# Patient Record
Sex: Female | Born: 1955 | Race: Black or African American | Hispanic: No | Marital: Married | Smoking: Never smoker
Health system: Southern US, Community
[De-identification: ages and names within clinical notes are randomized; demographics above are authoritative.]

## PROBLEM LIST (undated history)

## (undated) DIAGNOSIS — E119 Type 2 diabetes mellitus without complications: Secondary | ICD-10-CM

## (undated) DIAGNOSIS — M1990 Unspecified osteoarthritis, unspecified site: Secondary | ICD-10-CM

## (undated) DIAGNOSIS — M5136 Other intervertebral disc degeneration, lumbar region: Secondary | ICD-10-CM

## (undated) DIAGNOSIS — E1140 Type 2 diabetes mellitus with diabetic neuropathy, unspecified: Secondary | ICD-10-CM

## (undated) DIAGNOSIS — I10 Essential (primary) hypertension: Secondary | ICD-10-CM

## (undated) DIAGNOSIS — M797 Fibromyalgia: Secondary | ICD-10-CM

## (undated) DIAGNOSIS — K219 Gastro-esophageal reflux disease without esophagitis: Secondary | ICD-10-CM

## (undated) DIAGNOSIS — M722 Plantar fascial fibromatosis: Secondary | ICD-10-CM

## (undated) DIAGNOSIS — E079 Disorder of thyroid, unspecified: Secondary | ICD-10-CM

## (undated) DIAGNOSIS — M5416 Radiculopathy, lumbar region: Secondary | ICD-10-CM

## (undated) DIAGNOSIS — M5137 Other intervertebral disc degeneration, lumbosacral region: Secondary | ICD-10-CM

## (undated) DIAGNOSIS — G8929 Other chronic pain: Secondary | ICD-10-CM

## (undated) DIAGNOSIS — F419 Anxiety disorder, unspecified: Secondary | ICD-10-CM

## (undated) DIAGNOSIS — M51379 Other intervertebral disc degeneration, lumbosacral region without mention of lumbar back pain or lower extremity pain: Secondary | ICD-10-CM

## (undated) DIAGNOSIS — G4730 Sleep apnea, unspecified: Secondary | ICD-10-CM

## (undated) DIAGNOSIS — M549 Dorsalgia, unspecified: Secondary | ICD-10-CM

## (undated) DIAGNOSIS — G709 Myoneural disorder, unspecified: Secondary | ICD-10-CM

## (undated) HISTORY — DX: Plantar fascial fibromatosis: M72.2

## (undated) HISTORY — PX: ABDOMINAL HYSTERECTOMY: SHX81

## (undated) HISTORY — PX: OTHER SURGICAL HISTORY: SHX169

## (undated) HISTORY — DX: Myoneural disorder, unspecified: G70.9

## (undated) HISTORY — DX: Disorder of thyroid, unspecified: E07.9

## (undated) HISTORY — DX: Essential (primary) hypertension: I10

## (undated) HISTORY — PX: CHOLECYSTECTOMY: SHX55

## (undated) HISTORY — DX: Anxiety disorder, unspecified: F41.9

## (undated) HISTORY — DX: Type 2 diabetes mellitus without complications: E11.9

## (undated) HISTORY — PX: KNEE SURGERY: SHX244

## (undated) HISTORY — DX: Fibromyalgia: M79.7

## (undated) HISTORY — DX: Type 2 diabetes mellitus with diabetic neuropathy, unspecified: E11.40

---

## 2003-06-02 ENCOUNTER — Other Ambulatory Visit: Payer: Self-pay

## 2004-01-06 ENCOUNTER — Emergency Department: Payer: Self-pay | Admitting: Unknown Physician Specialty

## 2004-01-17 ENCOUNTER — Ambulatory Visit: Payer: Self-pay | Admitting: Otolaryngology

## 2004-08-10 ENCOUNTER — Ambulatory Visit: Payer: Self-pay

## 2005-04-19 ENCOUNTER — Ambulatory Visit: Payer: Self-pay | Admitting: Family Medicine

## 2006-10-27 ENCOUNTER — Emergency Department: Payer: Self-pay | Admitting: Emergency Medicine

## 2006-11-06 ENCOUNTER — Encounter: Payer: Self-pay | Admitting: Specialist

## 2006-11-17 ENCOUNTER — Encounter: Payer: Self-pay | Admitting: Specialist

## 2006-12-17 ENCOUNTER — Encounter: Payer: Self-pay | Admitting: Specialist

## 2007-01-17 ENCOUNTER — Encounter: Payer: Self-pay | Admitting: Specialist

## 2007-05-14 ENCOUNTER — Ambulatory Visit: Payer: Self-pay | Admitting: Family Medicine

## 2007-09-10 ENCOUNTER — Ambulatory Visit: Payer: Self-pay

## 2007-10-21 ENCOUNTER — Ambulatory Visit: Payer: Self-pay

## 2009-11-08 ENCOUNTER — Ambulatory Visit: Payer: Self-pay | Admitting: Gastroenterology

## 2010-04-28 ENCOUNTER — Emergency Department: Payer: Self-pay | Admitting: Emergency Medicine

## 2012-05-07 ENCOUNTER — Ambulatory Visit: Payer: Self-pay | Admitting: Pain Medicine

## 2012-05-21 ENCOUNTER — Ambulatory Visit: Payer: Self-pay | Admitting: Pain Medicine

## 2012-06-02 ENCOUNTER — Ambulatory Visit: Payer: Self-pay | Admitting: Pain Medicine

## 2012-06-17 ENCOUNTER — Ambulatory Visit: Payer: Self-pay | Admitting: Pain Medicine

## 2012-07-01 ENCOUNTER — Ambulatory Visit: Payer: Self-pay | Admitting: Pain Medicine

## 2012-07-13 ENCOUNTER — Ambulatory Visit: Payer: Self-pay | Admitting: Pain Medicine

## 2012-07-30 ENCOUNTER — Ambulatory Visit: Payer: Self-pay | Admitting: Pain Medicine

## 2013-03-31 DIAGNOSIS — E669 Obesity, unspecified: Secondary | ICD-10-CM | POA: Insufficient documentation

## 2013-03-31 DIAGNOSIS — M159 Polyosteoarthritis, unspecified: Secondary | ICD-10-CM | POA: Insufficient documentation

## 2013-03-31 DIAGNOSIS — G8929 Other chronic pain: Secondary | ICD-10-CM | POA: Insufficient documentation

## 2013-03-31 DIAGNOSIS — I10 Essential (primary) hypertension: Secondary | ICD-10-CM | POA: Insufficient documentation

## 2013-04-14 DIAGNOSIS — K219 Gastro-esophageal reflux disease without esophagitis: Secondary | ICD-10-CM | POA: Insufficient documentation

## 2013-12-21 ENCOUNTER — Ambulatory Visit: Payer: Self-pay | Admitting: Pain Medicine

## 2014-01-05 ENCOUNTER — Ambulatory Visit: Payer: Self-pay | Admitting: Pain Medicine

## 2014-01-17 ENCOUNTER — Ambulatory Visit: Payer: Self-pay | Admitting: Pain Medicine

## 2014-01-26 ENCOUNTER — Ambulatory Visit: Payer: Self-pay | Admitting: Pain Medicine

## 2014-02-15 ENCOUNTER — Ambulatory Visit: Payer: Self-pay | Admitting: Pain Medicine

## 2014-03-07 ENCOUNTER — Ambulatory Visit: Payer: Self-pay | Admitting: Pain Medicine

## 2014-03-15 ENCOUNTER — Ambulatory Visit: Payer: Self-pay | Admitting: Pain Medicine

## 2014-04-14 ENCOUNTER — Ambulatory Visit: Payer: Self-pay | Admitting: Pain Medicine

## 2014-05-17 ENCOUNTER — Ambulatory Visit: Payer: Self-pay | Admitting: Pain Medicine

## 2014-06-16 ENCOUNTER — Ambulatory Visit
Admit: 2014-06-16 | Disposition: A | Discharge status: Home / Self Care | Payer: Self-pay | Attending: Pain Medicine | Admitting: Pain Medicine

## 2014-07-14 ENCOUNTER — Ambulatory Visit
Admit: 2014-07-14 | Disposition: A | Discharge status: Home / Self Care | Payer: Self-pay | Attending: Pain Medicine | Admitting: Pain Medicine

## 2014-08-11 ENCOUNTER — Ambulatory Visit: Payer: Medicare Other | Attending: Pain Medicine | Admitting: Pain Medicine

## 2014-08-11 ENCOUNTER — Encounter (INDEPENDENT_AMBULATORY_CARE_PROVIDER_SITE_OTHER): Payer: Self-pay

## 2014-08-11 ENCOUNTER — Encounter: Payer: Self-pay | Admitting: Pain Medicine

## 2014-08-11 VITALS — BP 144/87 | HR 79 | Temp 98.2°F | Resp 16 | Ht 64.0 in | Wt 240.0 lb

## 2014-08-11 DIAGNOSIS — M5116 Intervertebral disc disorders with radiculopathy, lumbar region: Secondary | ICD-10-CM | POA: Diagnosis not present

## 2014-08-11 DIAGNOSIS — M5136 Other intervertebral disc degeneration, lumbar region: Secondary | ICD-10-CM | POA: Diagnosis not present

## 2014-08-11 DIAGNOSIS — M545 Low back pain: Secondary | ICD-10-CM | POA: Diagnosis present

## 2014-08-11 DIAGNOSIS — E1140 Type 2 diabetes mellitus with diabetic neuropathy, unspecified: Secondary | ICD-10-CM | POA: Diagnosis not present

## 2014-08-11 DIAGNOSIS — M79604 Pain in right leg: Secondary | ICD-10-CM | POA: Diagnosis present

## 2014-08-11 DIAGNOSIS — M79605 Pain in left leg: Secondary | ICD-10-CM | POA: Diagnosis present

## 2014-08-11 DIAGNOSIS — E1340 Other specified diabetes mellitus with diabetic neuropathy, unspecified: Secondary | ICD-10-CM

## 2014-08-11 DIAGNOSIS — M5030 Other cervical disc degeneration, unspecified cervical region: Secondary | ICD-10-CM | POA: Diagnosis not present

## 2014-08-11 DIAGNOSIS — M533 Sacrococcygeal disorders, not elsewhere classified: Secondary | ICD-10-CM | POA: Insufficient documentation

## 2014-08-11 DIAGNOSIS — M47816 Spondylosis without myelopathy or radiculopathy, lumbar region: Secondary | ICD-10-CM

## 2014-08-11 MED ORDER — OXYCODONE HCL 10 MG PO TABS: ORAL_TABLET | ORAL | Status: DC

## 2014-08-11 NOTE — Progress Notes (Signed)
Discharge to home ambulatory. Script given as ordered. teachback 3 complete.

## 2014-08-11 NOTE — Progress Notes (Signed)
   Subjective:    Patient ID: Diana Bishop, female    DOB: [birth year], 59 y.o.   MRN: [id number]  HPI  Patient is 59 year old female returns to pain management Center for further evaluation and treatment of pain involving the lower back and lower extremity region with pain involving the region of the neck of lesser degree. Patient denies trauma change in events of daily living the call significant change in her symptoms. Patient admits to some mild discomfort occurring when patient attempts to turn over in bed and states that the pain does become somewhat discomfortable near the end of the day if patient has been standing for significant period of time. We discussed radiofrequency procedures which we will avoid at this time and will continue medications as prescribed. Patient is to call pain management should there be change in condition prior to scheduled return appointment. Patient is understanding and agrees with plan.     Review of Systems     Objective:   Physical Exam   physical examination revealed tenderness over the region of the splenius capitis and occipitalis muscles. Palpation of the acromioclavicular and glenohumeral joint region reproduced minimal discomfort and there was  unremarkable Spurling's maneuver with minimal tenderness to palpation of the acromial clavicular and glenohumeral joint regions. There appeared to be bilaterally equal grip strength with without increased pain with Tinel and Phalen's maneuver. Palpation of the thoracic facet reproduced minimal discomfort and no crepitus of the thoracic region was noted. Palpation of the lumbar paraspinal musculature region lumbar facet region was associated with moderate discomfort with lateral bending and rotation and extension and palpation of the lumbar facets reproducing moderate discomfort. Straight leg raising was tolerated to approximately 20 without a definite increased pain with dorsiflexion noted and there was  negative clonus and negative Homans. There was mild tenderness over the PSIS and PII S regions and palpation of the region of the gluteal and piriformis muscles reproduced mild to moderate discomfort.. Abdomen nontender and no costovertebral angle tenderness noted      Assessment & Plan:    Degenerative disc disease lumbar spine L4-L5 level focal disc protrusion, mass effect upon the posterior lateral aspect of the CSF space as well as mass effect upon the central aspect of the L4 nerve root on the right consistent with nerve root compression and compromise, findings concerning for free fragment best appreciated on images 28 through 31 on axial T2 weighted images  Lumbar facet syndrome  Lumbar radiculopathy  Sacroiliac joint dysfunction  Diabetic neuropathy  Degenerative disc disease cervical spine  Cervical facet syndrome  Plan   Continue present medications.  F/U PCP for evaliation of  BP and general medical  condition.  F/U surgical evaluation.  F/U neurological evaluation.  May consider radiofrequency rhizolysis or intraspinal procedures pending response to present treatment and F/U evaluation.  Patient to call Pain Management Center should patient have concerns prior to scheduled return appointment.

## 2014-08-11 NOTE — Patient Instructions (Addendum)
Continue present medications.  F/U PCP for evaliation of  BP and general medical  condition.  F/U surgical evaluation.  F/U neurological evaluation.  May consider radiofrequency rhizolysis or intraspinal procedures pending response to present treatment and F/U evaluation.  Patient to call Pain Management Center should patient have concerns prior to scheduled return appointment. Pain Management Discharge Instructions  General Discharge Instructions :  If you need to reach your doctor call: Monday-Friday 8:00 am - 4:00 pm at [phone] or toll free [phone].  After clinic hours [phone] to have operator reach doctor.  Bring all of your medication bottles to all your appointments in the pain clinic.  To cancel or reschedule your appointment with Pain Management please remember to call 24 hours in advance to avoid a fee.  Refer to the educational materials which you have been given on: General Risks, I had my Procedure. Discharge Instructions, Post Sedation.  Post Procedure Instructions:  The drugs you were given will stay in your system until tomorrow, so for the next 24 hours you should not drive, make any legal decisions or drink any alcoholic beverages.  You may eat anything you prefer, but it is better to start with liquids then soups and crackers, and gradually work up to solid foods.  Please notify your doctor immediately if you have any unusual bleeding, trouble breathing or pain that is not related to your normal pain.  Depending on the type of procedure that was done, some parts of your body may feel week and/or numb.  This usually clears up by tonight or the next day.  Walk with the use of an assistive device or accompanied by an adult for the 24 hours.  You may use ice on the affected area for the first 24 hours.  Put ice in a Ziploc bag and cover with a towel and place against area 15 minutes on 15 minutes off.  You may switch to heat after 24 hours. 

## 2014-08-11 NOTE — Progress Notes (Signed)
   Subjective:    Patient ID: Diana Bishop, female    DOB: 04-Apr-1955, 59 y.o.   MRN: [id number]  HPI    Review of Systems     Objective:   Physical Exam        Assessment & Plan:

## 2014-08-18 ENCOUNTER — Other Ambulatory Visit: Payer: Self-pay | Admitting: Pain Medicine

## 2014-09-13 ENCOUNTER — Ambulatory Visit: Payer: Medicare Other | Attending: Pain Medicine | Admitting: Pain Medicine

## 2014-09-13 ENCOUNTER — Encounter: Payer: Self-pay | Admitting: Pain Medicine

## 2014-09-13 VITALS — BP 151/82 | HR 79 | Temp 97.8°F | Resp 16 | Ht 65.0 in | Wt 243.0 lb

## 2014-09-13 DIAGNOSIS — M7060 Trochanteric bursitis, unspecified hip: Secondary | ICD-10-CM | POA: Diagnosis not present

## 2014-09-13 DIAGNOSIS — M5416 Radiculopathy, lumbar region: Secondary | ICD-10-CM | POA: Diagnosis not present

## 2014-09-13 DIAGNOSIS — M79605 Pain in left leg: Secondary | ICD-10-CM | POA: Diagnosis present

## 2014-09-13 DIAGNOSIS — M4806 Spinal stenosis, lumbar region: Secondary | ICD-10-CM | POA: Insufficient documentation

## 2014-09-13 DIAGNOSIS — E1140 Type 2 diabetes mellitus with diabetic neuropathy, unspecified: Secondary | ICD-10-CM | POA: Diagnosis not present

## 2014-09-13 DIAGNOSIS — M533 Sacrococcygeal disorders, not elsewhere classified: Secondary | ICD-10-CM | POA: Diagnosis not present

## 2014-09-13 DIAGNOSIS — M5136 Other intervertebral disc degeneration, lumbar region: Secondary | ICD-10-CM | POA: Insufficient documentation

## 2014-09-13 DIAGNOSIS — M545 Low back pain: Secondary | ICD-10-CM | POA: Diagnosis present

## 2014-09-13 DIAGNOSIS — M79604 Pain in right leg: Secondary | ICD-10-CM | POA: Diagnosis present

## 2014-09-13 MED ORDER — OXYCODONE HCL 10 MG PO TABS: ORAL_TABLET | ORAL | Status: DC

## 2014-09-13 MED ORDER — DICLOFENAC SODIUM 1 % TD GEL: TRANSDERMAL | Status: DC

## 2014-09-13 NOTE — Progress Notes (Signed)
   Subjective:    Patient ID: Diana Bishop, female    DOB: September 16, 1955, 59 y.o.   MRN: [id number]  HPI  Patient is 59 year old female returns to [facility] for further evaluation and treatment of pain involving the lower back lower extremity region predominantly with pain of the neck and upper extremity region of lesser degree. Patient states that she has significant pain occurring in the region hips. The pain is aggravated by attempt to lie on the left and right side. Patient states that the pain awakens her from sleep. We discussed patient's condition patient appeared to be with component of greater trochanteric bursitis. We will prescribed Flector patch palpation and will continue oxycodone for treatment of patient's lower back lower extremity pain. After attempting to describe Flector patch insurance would not approve the Flector patch. We therefore prescribed Voltaren gel for the patient's pain consisting of what is felt to be greater trochanteric bursitis. The patient was understanding and in agreement status treatment plan. We will remain available to consider patient for greater trochanteric bursa injection should patient be with continued pain despite the present treatment regimen. The patient was understanding and agreement with suggested treatment plan.      Review of Systems     Objective:   Physical Exam   Palpation over the cervical cervical facet cervical paraspinal musculature region was with minimal tinged palpation. Palpation of the splenius capitis occipitalis regions reproduced mild discomfort. There was unremarkable Spurling's maneuver. Minimal tenderness of the acromioclavicular glenohumeral joint region. Tinel and Phalen's maneuver without increased pain of significant degree. Palpation of the thoracic facet and cervical facet and paraspinal musculature region was a tends to palpation of moderate degree. Palpation over the lumbar paraspinal musculature region  lumbar facet region was a tends to palpation of moderate degree with severe tenderness to palpation of the greater trochanteric region iliotibial band region on the left as well as on the right. Straight leg raising tolerates approximately 20 without increased pain with dorsiflexion noted. There was question decreased sensation of the lower extremities in a stocking type distribution. Abdomen was nontender and no costovertebral angle tenderness was noted.         Assessment & Plan:  Degenerative disc disease lumbar spine Findings have been consistent with a cranially migrating focal disc protrusion at L4-L5 causing mass effect upon the posterior lateral aspect of the thecal sac on the right causing mass effect upon the central aspect of the L4 nerve root on the right  Lumbar radiculopathy  Lumbar stenosis with neurogenic claudication  Lumbar facet syndrome  Diabetic neuropathy  Greater trochanteric bursitis  Sacroiliac joint dysfunction     Plan   Continue present medications oxycodone and begin full Voltaren gel. Please note that patient's insurance would not approve the Flector patch  F/U PCP for evaliation of  BP and general medical  condition.  F/U surgical evaluation  F/U neurological evaluation  May consider radiofrequency rhizolysis or intraspinal procedures pending response to present treatment and F/U evaluation.  Patient to call Pain Management Center should patient have concerns prior to scheduled return appointment.

## 2014-09-13 NOTE — Progress Notes (Signed)
Safety precautions to be maintained throughout the outpatient stay will include: orient to surroundings, keep bed in low position, maintain call bell within reach at all times, provide assistance with transfer out of bed and ambulation.  

## 2014-09-13 NOTE — Patient Instructions (Addendum)
Continue present medications oxycodone and generic Flector patch  F/U PCP for evaliation of  BP and general medical  condition.  F/U surgical evaluation.  F/U neurological evaluation.  May consider radiofrequency rhizolysis or intraspinal procedures pending response to present treatment and F/U evaluation.  Patient to call Pain Management Center should patient have concerns prior to scheduled return appointment.

## 2014-10-13 ENCOUNTER — Ambulatory Visit: Payer: Medicare Other | Attending: Pain Medicine | Admitting: Pain Medicine

## 2014-10-13 ENCOUNTER — Encounter: Payer: Self-pay | Admitting: Pain Medicine

## 2014-10-13 VITALS — BP 135/92 | HR 97 | Temp 98.1°F | Resp 18 | Ht 65.0 in | Wt 242.0 lb

## 2014-10-13 DIAGNOSIS — M5416 Radiculopathy, lumbar region: Secondary | ICD-10-CM | POA: Diagnosis not present

## 2014-10-13 DIAGNOSIS — M7060 Trochanteric bursitis, unspecified hip: Secondary | ICD-10-CM | POA: Diagnosis not present

## 2014-10-13 DIAGNOSIS — M4806 Spinal stenosis, lumbar region: Secondary | ICD-10-CM | POA: Diagnosis not present

## 2014-10-13 DIAGNOSIS — E1140 Type 2 diabetes mellitus with diabetic neuropathy, unspecified: Secondary | ICD-10-CM | POA: Diagnosis not present

## 2014-10-13 DIAGNOSIS — M79605 Pain in left leg: Secondary | ICD-10-CM | POA: Diagnosis present

## 2014-10-13 DIAGNOSIS — E1340 Other specified diabetes mellitus with diabetic neuropathy, unspecified: Secondary | ICD-10-CM

## 2014-10-13 DIAGNOSIS — M5136 Other intervertebral disc degeneration, lumbar region: Secondary | ICD-10-CM

## 2014-10-13 DIAGNOSIS — M533 Sacrococcygeal disorders, not elsewhere classified: Secondary | ICD-10-CM | POA: Diagnosis not present

## 2014-10-13 DIAGNOSIS — M545 Low back pain: Secondary | ICD-10-CM | POA: Diagnosis present

## 2014-10-13 DIAGNOSIS — M79604 Pain in right leg: Secondary | ICD-10-CM | POA: Diagnosis present

## 2014-10-13 DIAGNOSIS — M5126 Other intervertebral disc displacement, lumbar region: Secondary | ICD-10-CM | POA: Diagnosis not present

## 2014-10-13 DIAGNOSIS — M47816 Spondylosis without myelopathy or radiculopathy, lumbar region: Secondary | ICD-10-CM

## 2014-10-13 MED ORDER — OXYCODONE HCL 10 MG PO TABS: ORAL_TABLET | ORAL | Status: DC

## 2014-10-13 NOTE — Patient Instructions (Addendum)
Continue present medication oxycodone  F/U PCP Dr. Lennox Grumbles for evaliation of  BP diabetes mellitus and general medical  condition.  F/U surgical evaluation  F/U neurological evaluation  May consider radiofrequency rhizolysis or intraspinal procedures pending response to present treatment and F/U evaluation Please ask nurses for radiofrequency material for you to read..  Patient to call Pain Management Center should patient have concerns prior to scheduled return appointment. GENERAL RISKS AND COMPLICATIONS  What are the risk, side effects and possible complications? Generally speaking, most procedures are safe.  However, with any procedure there are risks, side effects, and the possibility of complications.  The risks and complications are dependent upon the sites that are lesioned, or the type of nerve block to be performed.  The closer the procedure is to the spine, the more serious the risks are.  Great care is taken when placing the radio frequency needles, block needles or lesioning probes, but sometimes complications can occur. 1. Infection: Any time there is an injection through the skin, there is a risk of infection.  This is why sterile conditions are used for these blocks.  There are four possible types of infection. 1. Localized skin infection. 2. Central Nervous System Infection-This can be in the form of Meningitis, which can be deadly. 3. Epidural Infections-This can be in the form of an epidural abscess, which can cause pressure inside of the spine, causing compression of the spinal cord with subsequent paralysis. This would require an emergency surgery to decompress, and there are no guarantees that the patient would recover from the paralysis. 4. Discitis-This is an infection of the intervertebral discs.  It occurs in about 1% of discography procedures.  It is difficult to treat and it may lead to surgery.        2. Pain: the needles have to go through skin and soft tissues, will  cause soreness.       3. Damage to internal structures:  The nerves to be lesioned may be near blood vessels or    other nerves which can be potentially damaged.       4. Bleeding: Bleeding is more common if the patient is taking blood thinners such as  aspirin, Coumadin, Ticiid, Plavix, etc., or if he/she have some genetic predisposition  such as hemophilia. Bleeding into the spinal canal can cause compression of the spinal  cord with subsequent paralysis.  This would require an emergency surgery to  decompress and there are no guarantees that the patient would recover from the  paralysis.       5. Pneumothorax:  Puncturing of a lung is a possibility, every time a needle is introduced in  the area of the chest or upper back.  Pneumothorax refers to free air around the  collapsed lung(s), inside of the thoracic cavity (chest cavity).  Another two possible  complications related to a similar event would include: Hemothorax and Chylothorax.   These are variations of the Pneumothorax, where instead of air around the collapsed  lung(s), you may have blood or chyle, respectively.       6. Spinal headaches: They may occur with any procedures in the area of the spine.       7. Persistent CSF (Cerebro-Spinal Fluid) leakage: This is a rare problem, but may occur  with prolonged intrathecal or epidural catheters either due to the formation of a fistulous  track or a dural tear.       8. Nerve damage: By working so close to  the spinal cord, there is always a possibility of  nerve damage, which could be as serious as a permanent spinal cord injury with  paralysis.       9. Death:  Although rare, severe deadly allergic reactions known as "Anaphylactic  reaction" can occur to any of the medications used.      10. Worsening of the symptoms:  We can always make thing worse.  What are the chances of something like this happening? Chances of any of this occuring are extremely low.  By statistics, you have more of a chance  of getting killed in a motor vehicle accident: while driving to the hospital than any of the above occurring .  Nevertheless, you should be aware that they are possibilities.  In general, it is similar to taking a shower.  Everybody knows that you can slip, hit your head and get killed.  Does that mean that you should not shower again?  Nevertheless always keep in mind that statistics do not mean anything if you happen to be on the wrong side of them.  Even if a procedure has a 1 (one) in a 1,000,000 (million) chance of going wrong, it you happen to be that one..Also, keep in mind that by statistics, you have more of a chance of having something go wrong when taking medications.  Who should not have this procedure? If you are on a blood thinning medication (e.g. Coumadin, Plavix, see list of "Blood Thinners"), or if you have an active infection going on, you should not have the procedure.  If you are taking any blood thinners, please inform your physician.  How should I prepare for this procedure?  Do not eat or drink anything at least six hours prior to the procedure.  Bring a driver with you .  It cannot be a taxi.  Come accompanied by an adult that can drive you back, and that is strong enough to help you if your legs get weak or numb from the local anesthetic.  Take all of your medicines the morning of the procedure with just enough water to swallow them.  If you have diabetes, make sure that you are scheduled to have your procedure done first thing in the morning, whenever possible.  If you have diabetes, take only half of your insulin dose and notify our nurse that you have done so as soon as you arrive at the clinic.  If you are diabetic, but only take blood sugar pills (oral hypoglycemic), then do not take them on the morning of your procedure.  You may take them after you have had the procedure.  Do not take aspirin or any aspirin-containing medications, at least eleven (11) days prior  to the procedure.  They may prolong bleeding.  Wear loose fitting clothing that may be easy to take off and that you would not mind if it got stained with Betadine or blood.  Do not wear any jewelry or perfume  Remove any nail coloring.  It will interfere with some of our monitoring equipment.  NOTE: Remember that this is not meant to be interpreted as a complete list of all possible complications.  Unforeseen problems may occur.  BLOOD THINNERS The following drugs contain aspirin or other products, which can cause increased bleeding during surgery and should not be taken for 2 weeks prior to and 1 week after surgery.  If you should need take something for relief of minor pain, you may take acetaminophen which is found  in Tylenol,m Datril, Anacin-3 and Panadol. It is not blood thinner. The products listed below are.  Do not take any of the products listed below in addition to any listed on your instruction sheet.  A.P.C or A.P.C with Codeine Codeine Phosphate Capsules #3 Ibuprofen Ridaura  ABC compound Congesprin Imuran rimadil  Advil Cope Indocin Robaxisal  Alka-Seltzer Effervescent Pain Reliever and Antacid Coricidin or Coricidin-D  Indomethacin Rufen  Alka-Seltzer plus Cold Medicine Cosprin Ketoprofen S-A-C Tablets  Anacin Analgesic Tablets or Capsules Coumadin Korlgesic Salflex  Anacin Extra Strength Analgesic tablets or capsules CP-2 Tablets Lanoril Salicylate  Anaprox Cuprimine Capsules Levenox Salocol  Anexsia-D Dalteparin Magan Salsalate  Anodynos Darvon compound Magnesium Salicylate Sine-off  Ansaid Dasin Capsules Magsal Sodium Salicylate  Anturane Depen Capsules Marnal Soma  APF Arthritis pain formula Dewitt's Pills Measurin Stanback  Argesic Dia-Gesic Meclofenamic Sulfinpyrazone  Arthritis Bayer Timed Release Aspirin Diclofenac Meclomen Sulindac  Arthritis pain formula Anacin Dicumarol Medipren Supac  Analgesic (Safety coated) Arthralgen Diffunasal Mefanamic Suprofen   Arthritis Strength Bufferin Dihydrocodeine Mepro Compound Suprol  Arthropan liquid Dopirydamole Methcarbomol with Aspirin Synalgos  ASA tablets/Enseals Disalcid Micrainin Tagament  Ascriptin Doan's Midol Talwin  Ascriptin A/D Dolene Mobidin Tanderil  Ascriptin Extra Strength Dolobid Moblgesic Ticlid  Ascriptin with Codeine Doloprin or Doloprin with Codeine Momentum Tolectin  Asperbuf Duoprin Mono-gesic Trendar  Aspergum Duradyne Motrin or Motrin IB Triminicin  Aspirin plain, buffered or enteric coated Durasal Myochrisine Trigesic  Aspirin Suppositories Easprin Nalfon Trillsate  Aspirin with Codeine Ecotrin Regular or Extra Strength Naprosyn Uracel  Atromid-S Efficin Naproxen Ursinus  Auranofin Capsules Elmiron Neocylate Vanquish  Axotal Emagrin Norgesic Verin  Azathioprine Empirin or Empirin with Codeine Normiflo Vitamin E  Azolid Emprazil Nuprin Voltaren  Bayer Aspirin plain, buffered or children's or timed BC Tablets or powders Encaprin Orgaran Warfarin Sodium  Buff-a-Comp Enoxaparin Orudis Zorpin  Buff-a-Comp with Codeine Equegesic Os-Cal-Gesic   Buffaprin Excedrin plain, buffered or Extra Strength Oxalid   Bufferin Arthritis Strength Feldene Oxphenbutazone   Bufferin plain or Extra Strength Feldene Capsules Oxycodone with Aspirin   Bufferin with Codeine Fenoprofen Fenoprofen Pabalate or Pabalate-SF   Buffets II Flogesic Panagesic   Buffinol plain or Extra Strength Florinal or Florinal with Codeine Panwarfarin   Buf-Tabs Flurbiprofen Penicillamine   Butalbital Compound Four-way cold tablets Penicillin   Butazolidin Fragmin Pepto-Bismol   Carbenicillin Geminisyn Percodan   Carna Arthritis Reliever Geopen Persantine   Carprofen Gold's salt Persistin   Chloramphenicol Goody's Phenylbutazone   Chloromycetin Haltrain Piroxlcam   Clmetidine heparin Plaquenil   Cllnoril Hyco-pap Ponstel   Clofibrate Hydroxy chloroquine Propoxyphen         Before stopping any of these medications,  be sure to consult the physician who ordered them.  Some, such as Coumadin (Warfarin) are ordered to prevent or treat serious conditions such as "deep thrombosis", "pumonary embolisms", and other heart problems.  The amount of time that you may need off of the medication may also vary with the medication and the reason for which you were taking it.  If you are taking any of these medications, please make sure you notify your pain physician before you undergo any procedures.         Radiofrequency Lesioning Radiofrequency lesioning is a procedure to relieve pain. The procedure is often used for back, neck, or arm pain. Radiofrequency lesioning uses a specialized machine that creates radio waves to make heat. The heat damages the nerve that carries the pain signal. Pain relief usually lasts 6 months to 1  year.  LET YOUR CAREGIVER KNOW ABOUT: 2. Allergies to food or medicine. 3. Medicines taken, including vitamins, herbs, eyedrops, over-the-counter medicines, and creams. 4. Use of steroids (by mouth or creams). 5. Previous problems with anesthetics or numbing medicines. 6. History of bleeding problems or blood clots. 7. Previous surgery. 8. Other health problems, including diabetes and kidney problems. 9. Possibility of pregnancy, if this applies. 10. Breathing problems and smoking history. 11. Any recent colds or infections. RISKS AND COMPLICATIONS This procedure is generally safe. The risks and complications depend on what treatment site is used. General complications may include:  Pain or soreness at the injection site.  Infection at the injection site.  Damage to nerves or blood vessels. BEFORE THE PROCEDURE  Ask your caregiver about changing or stopping any medicines you are on before the procedure.  If you take blood thinners, ask if you should stop taking them before the procedure.  You may be asked to wash with an antibiotic soap before the procedure.  Do not eat or drink  for 8 hours before your procedure or as told by your caregiver.  Ask your caregiver what time you need to arrive for your procedure.  This is an outpatient procedure. This means you will be able to go home the same day. Make plans for someone to drive you home. PROCEDURE  You will be awake during the procedure. You need to be able to talk to the surgeon during the procedure. However, you might be given medicine to help you relax (sedative).  Medicine to numb the area (local anesthetic) will be injected.  With the help of a type of X-ray (fluoroscopy), a radio frequency needle will be inserted into the area to be treated. Then, a wire that carries the radio waves (electrode) will be put through the radio frequency needle. An electrical pulse will be sent through the electrode to verify the correct nerve.  You will feel a tingling sensation similar to hitting your "funny bone." You may also have muscle twitching. The tissue around the needle tip is then heated when electric current is passed using the radio frequency machine. This numbs the nerves.  A bandage (dressing) will be put on the area after the procedure is done. AFTER THE PROCEDURE 12. You will stay in a recovery area until you are awake enough to eat and drink. 13. Once everything is back to normal, you will be able to go home. 14. You will need to arrange for someone to drive you home if you received a sedative or pain relieving medicine during the procedure. Document Released: 10/31/2010 Document Revised: 05/27/2011 Document Reviewed: 10/31/2010 [facility] Patient Information 2015 [place], [place]. This information is not intended to replace advice given to you by your health care provider. Make sure you discuss any questions you have with your health care provider.

## 2014-10-13 NOTE — Progress Notes (Signed)
Discharged to home ambulatory with script in hand for oxycodone.

## 2014-10-13 NOTE — Progress Notes (Signed)
Subjective:    Patient ID: Diana Bishop, female    DOB: [birth year], 59 y.o.   MRN: [id number]  HPI  Patient is 59 year old female returns to [place] for further evaluation and treatment of pain involving the lower back and lower extremity region as well as the neck and upper extremity regions. We discussed patient's condition and patient continues to have significant lower back lower extremity pain. Patient is attempting exercise at this time and is tolerating medications fairly well. We will also discussed radiofrequency rhizolysis of the lumbar facet, medial branch nerves, which would not require steroid injection and would therefore not affect patient's diabetes mellitus. We provided patient with information regarding radiofrequency raise rhizolysis of the lumbar region and will consider such treatment pending further evaluation of patient and follow-up evaluations and discussions of the procedure. The patient was understanding and in agreement status treatment plan    Review of Systems     Objective:   Physical Exam There was tenderness over the splenius capitis and occipitalis musculature region of mild to moderate degree. Mild to moderate tenderness of the cervical facet cervical paraspinal musculature region. Palpation over the thoracic facet thoracic paraspinal musculature region was a tends to palpation of moderate degree. No no excessive tends to palpation of the acromioclavicular glenohumeral joint region was noted. Patient appeared to be with slightly decreased grip strength. Tinel and Phalen's maneuver were without increase of pain of significant degree. There was tenderness over the lower thoracic paraspinal muscles region thoracic facet region a moderate degree with moderate muscle spasms without crepitus of the thoracic region noted. Lateral bending and rotation and extension and palpation of the lumbar facets reproduce moderate moderately severe discomfort. There  was moderate tenderness of the PSIS PII S region. There was moderate tenderness of the greater trochanteric region and iliotibial band region. There was moderate increase of pain with pressure prior to the ilium with patient in lateral decubitus position. Straight leg raising was tolerates approximately 20 without increased pain with dorsiflexion. There was negative clonus negative Homans. Abdomen nontender and no costovertebral angle tenderness noted.      Assessment & Plan:   Progress Notes   Hadli Vandemark (MR# [id number])      Progress Notes Info    Author Note Status Last Update User Last Update Date/Time   Mohammed Kindle, MD Signed Mohammed Kindle, MD 09/13/2014 3:43 PM    Progress Notes    Expand All Collapse All     Subjective:    Patient ID: Diana Bishop, female DOB: 02-24-1956, 59 y.o. MRN: [id number]  HPI  Patient is 59 year old female returns to [place] for further evaluation and treatment of pain involving the lower back lower extremity region predominantly with pain of the neck and upper extremity region of lesser degree. Patient states that she has significant pain occurring in the region hips. The pain is aggravated by attempt to lie on the left and right side. Patient states that the pain awakens her from sleep. We discussed patient's condition patient appeared to be with component of greater trochanteric bursitis. We will prescribed Flector patch palpation and will continue oxycodone for treatment of patient's lower back lower extremity pain. After attempting to describe Flector patch insurance would not approve the Flector patch. We therefore prescribed Voltaren gel for the patient's pain consisting of what is felt to be greater trochanteric bursitis. The patient was understanding and in agreement status treatment plan. We will remain available to consider patient  for greater trochanteric bursa injection should patient be with continued pain  despite the present treatment regimen. The patient was understanding and agreement with suggested treatment plan.      Review of Systems     Objective:   Physical Exam   Palpation over the cervical cervical facet cervical paraspinal musculature region was with minimal tinged palpation. Palpation of the splenius capitis occipitalis regions reproduced mild discomfort. There was unremarkable Spurling's maneuver. Minimal tenderness of the acromioclavicular glenohumeral joint region. Tinel and Phalen's maneuver without increased pain of significant degree. Palpation of the thoracic facet and cervical facet and paraspinal musculature region was a tends to palpation of moderate degree. Palpation over the lumbar paraspinal musculature region lumbar facet region was a tends to palpation of moderate degree with severe tenderness to palpation of the greater trochanteric region iliotibial band region on the left as well as on the right. Straight leg raising tolerates approximately 20 without increased pain with dorsiflexion noted. There was question decreased sensation of the lower extremities in a stocking type distribution. Abdomen was nontender and no costovertebral angle tenderness was noted.         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  Radiofrequency rhizolysis of the lumbar facet, medial branch nerves, to be considered pending follow-up  evaluation. Patient was provided written material today regarding radiofrequency procedure  F/U PCP Dr. Lennox Grumbles for evaliation of BP and general medical condition.  F/U surgical evaluation  F/U neurological evaluation  May consider radiofrequency rhizolysis or intraspinal procedures pending response to present treatment and F/U evaluation.  Patient to call [facility] should patient have concerns prior to scheduled return appointment.

## 2014-11-15 ENCOUNTER — Encounter: Payer: Self-pay | Admitting: Pain Medicine

## 2014-11-15 ENCOUNTER — Ambulatory Visit: Payer: Medicare Other | Attending: Pain Medicine | Admitting: Pain Medicine

## 2014-11-15 VITALS — BP 136/85 | HR 92 | Temp 98.0°F | Resp 18 | Ht 65.0 in | Wt 242.0 lb

## 2014-11-15 DIAGNOSIS — M79605 Pain in left leg: Secondary | ICD-10-CM | POA: Diagnosis present

## 2014-11-15 DIAGNOSIS — M545 Low back pain: Secondary | ICD-10-CM | POA: Diagnosis present

## 2014-11-15 DIAGNOSIS — M533 Sacrococcygeal disorders, not elsewhere classified: Secondary | ICD-10-CM

## 2014-11-15 DIAGNOSIS — M5136 Other intervertebral disc degeneration, lumbar region: Secondary | ICD-10-CM

## 2014-11-15 DIAGNOSIS — E1340 Other specified diabetes mellitus with diabetic neuropathy, unspecified: Secondary | ICD-10-CM

## 2014-11-15 DIAGNOSIS — M79604 Pain in right leg: Secondary | ICD-10-CM | POA: Insufficient documentation

## 2014-11-15 DIAGNOSIS — M7060 Trochanteric bursitis, unspecified hip: Secondary | ICD-10-CM

## 2014-11-15 DIAGNOSIS — M47816 Spondylosis without myelopathy or radiculopathy, lumbar region: Secondary | ICD-10-CM

## 2014-11-15 MED ORDER — OXYCODONE HCL 10 MG PO TABS: ORAL_TABLET | ORAL | Status: DC

## 2014-11-15 NOTE — Progress Notes (Signed)
Safety precautions to be maintained throughout the outpatient stay will include: orient to surroundings, keep bed in low position, maintain call bell within reach at all times, provide assistance with transfer out of bed and ambulation.  

## 2014-11-15 NOTE — Progress Notes (Signed)
   Subjective:    Patient ID: Diana Bishop, female    DOB: 11/05/1955, 59 y.o.   MRN: [id number]  HPI    Review of Systems     Objective:   Physical Exam        Assessment & Plan:   

## 2014-11-15 NOTE — Patient Instructions (Signed)
Continue present medication oxycodone  F/U PCP Dr. Lennox Grumbles for evaliation of  BP and general medical  condition  F/U surgical evaluation. Will avoid at this time  F/U neurological evaluation  May consider radiofrequency rhizolysis or intraspinal procedures pending response to present treatment and F/U evaluation   Patient to call [facility] should patient have concerns prior to scheduled return appointment.

## 2014-11-15 NOTE — Progress Notes (Signed)
   Subjective:    Patient ID: Diana Bishop, female    DOB: [birth year], 59 y.o.   MRN: [id number]  HPI Patient 59 year old female returns to [place] for further evaluation and treatment of pain involving the lower back and lower extremity region predominantly. Patient stated that she had to do some excessive walking recently when she had to take her son to [facility]. Patient states that the long walks appears to have aggravated her lower back and lower extremity pain. We discussed interventional treatment and may consider interventional treatment should the symptoms persist. At present time we'll continue medications as prescribed and patient will call [facility] Management Center should her symptoms persist and she wishes to proceed with interventional treatment. We will consider modification of medications as well as interventional treatment. The patient was understanding and agree with suggested treatment plan.     Review of Systems     Objective:   Physical Exam  There was tenderness of the splenius capitis and occipitalis musculature regions.. Palpation over the region of the acromioclavicular glenohumeral joint region. With mild discomfort. There was mild tenderness of the cervical facet cervical paraspinal musculature region. Palpation over the thoracic facet thoracic paraspinal muscles region was with mild discomfort as well. There was no crepitus of the thoracic region noted. Palpation over the lumbar paraspinal musculature region lumbar facet region associated with moderate to moderately severe discomfort. Lateral bending and rotation and extension and palpation of the lumbar facets reproduce moderate to moderately severe discomfort. There was tends to palpation of the PSIS and PII S region of moderate degree as well straight leg raising was limited to approximately 30 without increase of pain with dorsiflexion noted. There was negative clonus negative Homans. EHL strength  appeared to be decreased no definite sensory deficit of dermatomal distribution was detected. There was question decreased sensation of generalized stocking type distribution of the lower extremities noted. Abdomen nontender and no costovertebral tenderness noted.      Assessment & Plan:    Continue present medication oxycodone  F/U PCP  Dr. Lennox Grumbles for evaliation of  BP and general medical  condition  F/U surgical evaluation Will avoid at this time  F/U neurological evaluation. May consider further evaluation including PNCV EMG studies  May consider radiofrequency rhizolysis or intraspinal procedures pending response to present treatment and F/U evaluation   Patient to call [facility] should patient have concerns prior to scheduled return appointment.

## 2014-11-24 ENCOUNTER — Other Ambulatory Visit: Payer: Self-pay | Admitting: Pain Medicine

## 2014-12-13 ENCOUNTER — Encounter: Payer: Self-pay | Admitting: Pain Medicine

## 2014-12-13 ENCOUNTER — Ambulatory Visit: Payer: Medicare Other | Attending: Pain Medicine | Admitting: Pain Medicine

## 2014-12-13 VITALS — BP 144/85 | HR 77 | Temp 97.9°F | Resp 16 | Ht 65.0 in | Wt 242.0 lb

## 2014-12-13 DIAGNOSIS — M79605 Pain in left leg: Secondary | ICD-10-CM | POA: Diagnosis present

## 2014-12-13 DIAGNOSIS — M5416 Radiculopathy, lumbar region: Secondary | ICD-10-CM | POA: Insufficient documentation

## 2014-12-13 DIAGNOSIS — E1140 Type 2 diabetes mellitus with diabetic neuropathy, unspecified: Secondary | ICD-10-CM | POA: Insufficient documentation

## 2014-12-13 DIAGNOSIS — M5126 Other intervertebral disc displacement, lumbar region: Secondary | ICD-10-CM | POA: Diagnosis not present

## 2014-12-13 DIAGNOSIS — M545 Low back pain: Secondary | ICD-10-CM | POA: Diagnosis present

## 2014-12-13 DIAGNOSIS — M79604 Pain in right leg: Secondary | ICD-10-CM | POA: Diagnosis present

## 2014-12-13 DIAGNOSIS — M533 Sacrococcygeal disorders, not elsewhere classified: Secondary | ICD-10-CM | POA: Diagnosis not present

## 2014-12-13 DIAGNOSIS — M5136 Other intervertebral disc degeneration, lumbar region: Secondary | ICD-10-CM | POA: Insufficient documentation

## 2014-12-13 DIAGNOSIS — E1340 Other specified diabetes mellitus with diabetic neuropathy, unspecified: Secondary | ICD-10-CM

## 2014-12-13 DIAGNOSIS — M7060 Trochanteric bursitis, unspecified hip: Secondary | ICD-10-CM | POA: Insufficient documentation

## 2014-12-13 DIAGNOSIS — M47816 Spondylosis without myelopathy or radiculopathy, lumbar region: Secondary | ICD-10-CM

## 2014-12-13 DIAGNOSIS — M4806 Spinal stenosis, lumbar region: Secondary | ICD-10-CM | POA: Insufficient documentation

## 2014-12-13 MED ORDER — OXYCODONE HCL 10 MG PO TABS: ORAL_TABLET | ORAL | Status: DC

## 2014-12-13 MED ORDER — DICLOFENAC EPOLAMINE 1.3 % TD PTCH: 1.0000 | MEDICATED_PATCH | Freq: Two times a day (BID) | TRANSDERMAL | Status: DC

## 2014-12-13 MED ORDER — DICLOFENAC EPOLAMINE 1.3 % TD PTCH: MEDICATED_PATCH | TRANSDERMAL | Status: DC

## 2014-12-13 NOTE — Patient Instructions (Addendum)
PLAN   Continue present medication Flector patch and oxycodone. Asked the nurses about your Flector patch and the form you received  F/U PCP Dr. Lennox Grumbles for evaliation of  BP and general medical  condition  F/U surgical evaluation. May consider pending follow-up evaluations  F/U neurological evaluation. May consider pending follow-up evaluations  May consider radiofrequency rhizolysis or intraspinal procedures pending response to present treatment and F/U evaluation   Patient to call [facility] should patient have concerns prior to scheduled return appointment.

## 2014-12-13 NOTE — Progress Notes (Signed)
   Subjective:    Patient ID: Diana Bishop, female    DOB: 1955-07-23, 59 y.o.   MRN: [id number]  HPI Patient 59 year old female returns to [place] for further evaluation and treatment of pain involving the lower back lower extremity region. On today's visit patient stated the pain is fairly well-controlled at the present treatment regimen consisting of oxycodone. Patient did admit to a significant component of greater trochanteric bursitis. We have attempted to prescribe Flector patch for patient. On today's visit we prescribe Flector patch and generic form for patient. We will continue oxycodone and patient will begin Flector patch generic form as discussed and as instructed. We will remain available to consider patient for additional modification of treatment pending follow-up evaluation. The patient was understanding and in agreement with suggested treatment plan. Patient denies trauma change in events of daily living the call significant change in symptomatology.   Review of Systems     Objective:   Physical Exam There was mild tinnitus of the splenius capitis and occipitalis musculature regions. No masses of the head and neck were noted. There was tends to palpation over the cervical facet cervical paraspinal musculature region of mild degree. There was unremarkable Spurling's maneuver. There was minimal tenderness of the acromioclavicular and glenohumeral joint regions. Patient was with slightly decreased grip strength. Tinel and Phalen's maneuver were without increase of pain of significant degree. There was tenderness of the thoracic facet thoracic paraspinal musculature region of mild degree. No crepitus of the thoracic region was noted. Palpation over the lumbar paraspinal musculature and lumbar facet region was a tends to palpation of moderate degree. Lateral bending and rotation associated with moderate discomfort. Palpation over the PSIS and PII S region reproduced  moderate discomfort. There was moderate to moderately severe tends to palpation of the greater trochanteric region and iliotibial band regions. Straight leg raising was tolerates approximately 20. No definite increase of pain with dorsiflexion was noted. EHL strength appeared to be decreased. There was questionably decreased sensation of the lower extremities in a stocking type distribution. There was negative clonus negative Homans. Abdomen was nontender and no costovertebral angle tenderness was noted.       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 Lennox Grumbles for evaliation of  BP and general medical  condition  F/U surgical evaluation. May consider pending follow-up evaluations  F/U neurological evaluation. May consider pending follow-up evaluations  May consider radiofrequency rhizolysis or intraspinal procedures pending response to present treatment and F/U evaluation   Patient to call [facility] should patient have concerns prior to scheduled return appointment.

## 2015-01-12 ENCOUNTER — Encounter: Payer: Self-pay | Admitting: Pain Medicine

## 2015-01-12 ENCOUNTER — Ambulatory Visit: Payer: Medicare Other | Attending: Pain Medicine | Admitting: Pain Medicine

## 2015-01-12 VITALS — BP 145/91 | HR 74 | Temp 97.7°F | Resp 18 | Ht 65.0 in | Wt 242.0 lb

## 2015-01-12 DIAGNOSIS — M79606 Pain in leg, unspecified: Secondary | ICD-10-CM | POA: Insufficient documentation

## 2015-01-12 DIAGNOSIS — M47816 Spondylosis without myelopathy or radiculopathy, lumbar region: Secondary | ICD-10-CM

## 2015-01-12 DIAGNOSIS — M7060 Trochanteric bursitis, unspecified hip: Secondary | ICD-10-CM

## 2015-01-12 DIAGNOSIS — M5136 Other intervertebral disc degeneration, lumbar region: Secondary | ICD-10-CM

## 2015-01-12 DIAGNOSIS — M545 Low back pain: Secondary | ICD-10-CM | POA: Insufficient documentation

## 2015-01-12 DIAGNOSIS — E1340 Other specified diabetes mellitus with diabetic neuropathy, unspecified: Secondary | ICD-10-CM

## 2015-01-12 DIAGNOSIS — M533 Sacrococcygeal disorders, not elsewhere classified: Secondary | ICD-10-CM

## 2015-01-12 MED ORDER — OXYCODONE HCL 10 MG PO TABS: ORAL_TABLET | ORAL | Status: DC

## 2015-01-12 NOTE — Patient Instructions (Signed)
PLAN  Continue present medication oxycodone  F/U PCP Dr. Lennox Grumbles for evaliation of  BP and general medical  condition  F/U surgical evaluation. Will avoid at this time  F/U neurological evaluation May consider PNCV EMG studies and other studies  May consider radiofrequency rhizolysis or intraspinal procedures pending response to present treatment and F/U evaluation   Patient to call [facility] Management [facility] should patient have concerns prior to scheduled return appointment.

## 2015-01-12 NOTE — Progress Notes (Signed)
   Subjective:    Patient ID: Diana Bishop, female    DOB: Feb 08, 1956, 59 y.o.   MRN: [id number]  HPI Patient is 59 year old female who returns to [place] for further evaluation and treatment of pain involving the region of the lower back and lower extremity regions predominantly patient states that she is with pain fairly well-controlled at this time. Patient states that the pain in the lumbar region radiates to the lower extremities on the left as well as on the right patient also has significant tenderness to palpation in the greater trochanteric region and iliotibial band regions which have interfered with activities of daily living as well as ability to obtain restful sleep. We will continue presently prescribed medications and remain available to consider patient for interventional treatment should patient wished to proceed with such. The patient was with understanding and in agreement with suggested treatment plan   Review of Systems     Objective:   Physical Exam there was tenderness of the splenius capitis and occipitalis musculature regions. Palpation of these regions reproduced pain of mild degree. There was mild tenderness of the acromioclavicular and glenohumeral joint region. There was unremarkable Spurling's maneuver The patient appeared to be with bilaterally equal grip strength and Tinel and Phalen's maneuver were without increase of pain of significant degree. Palpation over the region of the thoracic facet thoracic paraspinal musculature region was a tends to palpation of moderate degree with moderate muscle spasms of the lower thoracic paraspinal musculature region. Palpation over the lumbar paraspinal musculatures and lumbar facet region was with moderate discomfort discomfort lateral bending and rotation and extension and palpation of the lumbar facets reproduce moderate discomfort. Straight leg raising tolerates approximately 20 without increased pain with  dorsiflexion noted. DTRs appear to be trace at the knees and there was negative clonus and negative Homans . Abdomen was nontender with no costovertebral angle tenderness noted      Assessment & Plan:  PLAN  Continue present medication oxycodone  F/U PCP  Dr. Lennox Grumbles for evaliation of  BP and general medical  condition  F/U surgical evaluation Will avoid at this time  F/U neurological evaluation. May consider further evaluation including PNCV EMG studies  May consider radiofrequency rhizolysis or intraspinal procedures pending response to present treatment and F/U evaluation   Patient to call [facility] Center should patient have concerns prior to scheduled return appointment.

## 2015-01-12 NOTE — Progress Notes (Signed)
Safety precautions to be maintained throughout the outpatient stay will include: orient to surroundings, keep bed in low position, maintain call bell within reach at all times, provide assistance with transfer out of bed and ambulation.  

## 2015-02-07 ENCOUNTER — Encounter: Payer: Self-pay | Admitting: Pain Medicine

## 2015-02-07 ENCOUNTER — Ambulatory Visit: Payer: Medicare Other | Attending: Pain Medicine | Admitting: Pain Medicine

## 2015-02-07 VITALS — BP 158/88 | HR 90 | Temp 98.2°F | Resp 16 | Ht 65.0 in | Wt 242.0 lb

## 2015-02-07 DIAGNOSIS — M7060 Trochanteric bursitis, unspecified hip: Secondary | ICD-10-CM | POA: Insufficient documentation

## 2015-02-07 DIAGNOSIS — M5116 Intervertebral disc disorders with radiculopathy, lumbar region: Secondary | ICD-10-CM | POA: Insufficient documentation

## 2015-02-07 DIAGNOSIS — M5126 Other intervertebral disc displacement, lumbar region: Secondary | ICD-10-CM | POA: Insufficient documentation

## 2015-02-07 DIAGNOSIS — M4806 Spinal stenosis, lumbar region: Secondary | ICD-10-CM | POA: Diagnosis not present

## 2015-02-07 DIAGNOSIS — M79605 Pain in left leg: Secondary | ICD-10-CM | POA: Diagnosis present

## 2015-02-07 DIAGNOSIS — E1140 Type 2 diabetes mellitus with diabetic neuropathy, unspecified: Secondary | ICD-10-CM | POA: Insufficient documentation

## 2015-02-07 DIAGNOSIS — M47816 Spondylosis without myelopathy or radiculopathy, lumbar region: Secondary | ICD-10-CM

## 2015-02-07 DIAGNOSIS — M5136 Other intervertebral disc degeneration, lumbar region: Secondary | ICD-10-CM

## 2015-02-07 DIAGNOSIS — M79604 Pain in right leg: Secondary | ICD-10-CM | POA: Diagnosis present

## 2015-02-07 DIAGNOSIS — E1340 Other specified diabetes mellitus with diabetic neuropathy, unspecified: Secondary | ICD-10-CM

## 2015-02-07 DIAGNOSIS — M533 Sacrococcygeal disorders, not elsewhere classified: Secondary | ICD-10-CM

## 2015-02-07 DIAGNOSIS — M545 Low back pain: Secondary | ICD-10-CM | POA: Diagnosis present

## 2015-02-07 MED ORDER — OXYCODONE HCL 10 MG PO TABS: ORAL_TABLET | ORAL | Status: DC

## 2015-02-07 MED ORDER — TIZANIDINE HCL 2 MG PO CAPS: ORAL_CAPSULE | ORAL | Status: DC

## 2015-02-07 NOTE — Progress Notes (Signed)
   Subjective:    Patient ID: Diana Bishop, female    DOB: January 14, 1956, 59 y.o.   MRN: [id number]  HPI  The patient is a 59 year old female who [facility] evaluation and treatment of pain involving the region of the lower back and lower extremity region with pain occurring in the region of the neck last degree. Patient states his lower back and lower extremity pain is fairly well-controlled at this time patient continues to be with significant component of pain due to greater trochanteric bursitis. Patient's insurance company has not approved patient for Flector patch to be applied to the greater trochanteric regions. At the present time we will continue present medication consisting of oxycodone and we'll remain available to consider interventional treatment pending follow-up evaluation the patient was understanding and agreed to suggested treatment plan    Review of Systems     Objective:   Physical Exam  There was mild tenderness to palpation of the splenius capitis and a separate talus musculature regions. Palpation over the cervical facet cervical paraspinal musculature region reproduced mild discomfort. There was mild tenderness of the acromioclavicular and glenohumeral joint region. Patient appeared to be with slightly decreased grip strength and Tinel and Phalen's maneuver were without increased pain of significant degree. There was minimal tenderness to palpation of the thoracic facet thoracic paraspinal musculature region with no crepitus of the thoracic region noted. Palpation over the lumbar paraspinal musculature region lumbar facet region was associated with moderate discomfort with lateral bending rotation extension and palpation of the lumbar facets reproducing moderate to moderately severe discomfort. There was moderate to moderately severe tenderness to palpation of the greater trochanteric region iliotibial band region there was moderate  tenderness to palpation of the PSIS and PI is region. Straight leg raising was tolerates approximately 20 without increased pain with dorsiflexion noted. DTRs were difficult to elicit patient had difficulty relaxing. No sensory deficit or dermatomal distribution detected. Negative clonus negative Homans. Abdomen nontender with no costovertebral tenderness noted.    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 Caution Zanaflex can cause respiratory depression confusion excessive sedation and other side effects   F/U PCP Dr. Lennox Grumbles for evaliation of  BP and general medical  condition  F/U surgical evaluation. May consider pending follow-up evaluations  F/U neurological evaluation. May consider pending follow-up evaluations  May consider radiofrequency rhizolysis or intraspinal procedures pending response to present treatment and F/U evaluation   Patient to call [facility] should patient have concerns prior to scheduled return appointment.

## 2015-02-07 NOTE — Progress Notes (Signed)
Safety precautions to be maintained throughout the outpatient stay will include: orient to surroundings, keep bed in low position, maintain call bell within reach at all times, provide assistance with transfer out of bed and ambulation.  

## 2015-02-07 NOTE — Patient Instructions (Addendum)
PLAN   Continue present medication  oxycodone Begin Zanaflex. Caution Zanaflex can cause respiratory depression confusion excessive sedation and other side effects   F/U PCP Dr. Lennox Grumbles for evaliation of  BP and general medical  condition  F/U surgical evaluation. May consider pending follow-up evaluations  F/U neurological evaluation. May consider pending follow-up evaluations  May consider radiofrequency rhizolysis or intraspinal procedures pending response to present treatment and F/U evaluation   Patient to call [facility] should patient have concerns prior to scheduled return appointment.

## 2015-02-24 ENCOUNTER — Other Ambulatory Visit: Payer: Self-pay | Admitting: Pain Medicine

## 2015-03-02 ENCOUNTER — Telehealth: Payer: Self-pay

## 2015-03-02 NOTE — Telephone Encounter (Signed)
Pt says [facility] will be calling... She has had issues with trying to get her meds. Pt says her prescription should be in pill from instead of capsules so that her insurance will pay for it.

## 2015-03-09 ENCOUNTER — Encounter: Payer: Self-pay | Admitting: Pain Medicine

## 2015-03-09 ENCOUNTER — Ambulatory Visit: Payer: Medicare Other | Attending: Pain Medicine | Admitting: Pain Medicine

## 2015-03-09 VITALS — BP 129/89 | HR 84 | Temp 97.9°F | Resp 18 | Ht 65.0 in | Wt 242.0 lb

## 2015-03-09 DIAGNOSIS — M5116 Intervertebral disc disorders with radiculopathy, lumbar region: Secondary | ICD-10-CM | POA: Insufficient documentation

## 2015-03-09 DIAGNOSIS — M533 Sacrococcygeal disorders, not elsewhere classified: Secondary | ICD-10-CM

## 2015-03-09 DIAGNOSIS — M5126 Other intervertebral disc displacement, lumbar region: Secondary | ICD-10-CM | POA: Insufficient documentation

## 2015-03-09 DIAGNOSIS — E1140 Type 2 diabetes mellitus with diabetic neuropathy, unspecified: Secondary | ICD-10-CM | POA: Diagnosis not present

## 2015-03-09 DIAGNOSIS — M47816 Spondylosis without myelopathy or radiculopathy, lumbar region: Secondary | ICD-10-CM

## 2015-03-09 DIAGNOSIS — M4806 Spinal stenosis, lumbar region: Secondary | ICD-10-CM | POA: Insufficient documentation

## 2015-03-09 DIAGNOSIS — E1340 Other specified diabetes mellitus with diabetic neuropathy, unspecified: Secondary | ICD-10-CM

## 2015-03-09 DIAGNOSIS — M7060 Trochanteric bursitis, unspecified hip: Secondary | ICD-10-CM | POA: Diagnosis not present

## 2015-03-09 DIAGNOSIS — M79601 Pain in right arm: Secondary | ICD-10-CM | POA: Diagnosis present

## 2015-03-09 DIAGNOSIS — M5136 Other intervertebral disc degeneration, lumbar region: Secondary | ICD-10-CM

## 2015-03-09 DIAGNOSIS — M79602 Pain in left arm: Secondary | ICD-10-CM | POA: Diagnosis present

## 2015-03-09 DIAGNOSIS — M542 Cervicalgia: Secondary | ICD-10-CM | POA: Diagnosis present

## 2015-03-09 MED ORDER — TIZANIDINE HCL 2 MG PO CAPS: ORAL_CAPSULE | ORAL | Status: DC

## 2015-03-09 MED ORDER — OXYCODONE HCL 10 MG PO TABS: ORAL_TABLET | ORAL | Status: DC

## 2015-03-09 NOTE — Progress Notes (Signed)
Safety precautions to be maintained throughout the outpatient stay will include: orient to surroundings, keep bed in low position, maintain call bell within reach at all times, provide assistance with transfer out of bed and ambulation.  Called pharmacy and had them change the zanaflex to tablets instead of capsules.

## 2015-03-09 NOTE — Patient Instructions (Addendum)
PLAN   Continue present medication  Oxycodone and  Zanaflex.  F/U PCP Dr. Lennox Grumbles for evaliation of  BP diabetes mellitus and general medical  condition  F/U surgical evaluation. May consider pending follow-up evaluations  F/U neurological evaluation. May consider pending follow-up evaluations  May consider radiofrequency rhizolysis or intraspinal procedures pending response to present treatment and F/U evaluation   Patient to call [facility] Management Center should patient have concerns prior to scheduled return appointment.

## 2015-03-09 NOTE — Progress Notes (Signed)
   Subjective:    Patient ID: Diana Bishop, female    DOB: 1955-12-04, 59 y.o.   MRN: [id number]  HPI  The patient is a 59 year old female who returns to pain management Center for further evaluation and treatment of pain involving the neck upper extremity regions as well as the mid lower back and lower extremity regions. Patient states the pain is fairly well controlled present treatment regimen. Patient is attempting to exercise and reduce weight. The patient denies any trauma change in events of daily living because change in symptomatology. We discussed patient's condition and will continue present medications as prescribed and remain available to consider modification of treatment should they be change in condition. The patient is tolerating Zanaflex well without undesirable side effects and continues oxycodone. We will consider additional modifications pending follow-up evaluation. The patient is without plans for surgical intervention. We will consider interventional treatment in pain management as discussed. The patient was with understanding and in agreement with suggested treatment plan     Review of Systems     Objective:   Physical Exam  There was tenderness of the splenius capitis and occipitalis musculature regions palpation which reproduces pain of mild degree with mild tenderness of the acromioclavicular and glenohumeral joint region. Patient appeared to be unremarkable Spurling's maneuver. There was mild increased pain with Tinel and Phalen's maneuver and patient appeared to be with bilaterally equal grip strength. Palpation of the cervical facet cervical paraspinal musculature region as well as the thoracic facet thoracic paraspinal musculature regions reproduced mild to moderate discomfort. No crepitus of the thoracic region was noted. Palpation over the lumbar paraspinal musculatures and lumbar facet region was with mild to moderate discomfort. Lateral bending rotation  extension and palpation of the lumbar facets reproduce mild to moderate discomfort. There was tends to palpation over the PSIS and PII S regions a moderate degree with moderate tends to palpation over the greater trochanteric region iliotibial band region. Straight leg raise was tolerates approximately 20 without increased pain with dorsiflexion noted. There appeared to be negative clonus negative Homans. DTRs were difficult to elicit patient had difficulty relaxing. 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  F/U PCP Dr. Lennox Grumbles for evaliation of  BP diabetes mellitus and general medical  condition  F/U surgical evaluation. May consider pending follow-up evaluations  F/U neurological evaluation. May consider pending follow-up evaluations  May consider radiofrequency rhizolysis or intraspinal procedures pending response to present treatment and F/U evaluation   Patient to call Pain Management Center should patient have concerns prior to scheduled return appointment.

## 2015-04-11 ENCOUNTER — Encounter: Payer: Self-pay | Admitting: Pain Medicine

## 2015-04-11 ENCOUNTER — Ambulatory Visit: Payer: Medicare Other | Attending: Pain Medicine | Admitting: Pain Medicine

## 2015-04-11 VITALS — BP 125/83 | HR 85 | Temp 97.6°F | Resp 16 | Ht 64.0 in | Wt 243.0 lb

## 2015-04-11 DIAGNOSIS — M533 Sacrococcygeal disorders, not elsewhere classified: Secondary | ICD-10-CM

## 2015-04-11 DIAGNOSIS — M4806 Spinal stenosis, lumbar region: Secondary | ICD-10-CM | POA: Diagnosis not present

## 2015-04-11 DIAGNOSIS — M5126 Other intervertebral disc displacement, lumbar region: Secondary | ICD-10-CM | POA: Insufficient documentation

## 2015-04-11 DIAGNOSIS — M5116 Intervertebral disc disorders with radiculopathy, lumbar region: Secondary | ICD-10-CM | POA: Insufficient documentation

## 2015-04-11 DIAGNOSIS — E1340 Other specified diabetes mellitus with diabetic neuropathy, unspecified: Secondary | ICD-10-CM

## 2015-04-11 DIAGNOSIS — E1140 Type 2 diabetes mellitus with diabetic neuropathy, unspecified: Secondary | ICD-10-CM | POA: Insufficient documentation

## 2015-04-11 DIAGNOSIS — M545 Low back pain: Secondary | ICD-10-CM | POA: Diagnosis present

## 2015-04-11 DIAGNOSIS — M5136 Other intervertebral disc degeneration, lumbar region: Secondary | ICD-10-CM

## 2015-04-11 DIAGNOSIS — M7060 Trochanteric bursitis, unspecified hip: Secondary | ICD-10-CM | POA: Insufficient documentation

## 2015-04-11 DIAGNOSIS — M47816 Spondylosis without myelopathy or radiculopathy, lumbar region: Secondary | ICD-10-CM

## 2015-04-11 MED ORDER — OXYCODONE HCL 10 MG PO TABS: ORAL_TABLET | ORAL | Status: DC

## 2015-04-11 MED ORDER — TIZANIDINE HCL 2 MG PO CAPS
ORAL_CAPSULE | ORAL | Status: DC
Start: 2015-04-11 — End: 2015-05-11

## 2015-04-11 NOTE — Progress Notes (Signed)
   Subjective:    Patient ID: Diana Bishop, female    DOB: [birth year], 60 y.o.   MRN: [id number]  HPI  The patient is a 60 year old female who returns to pain management for further evaluation and treatment of pain involving the lower back and lower extremity region predominantly on today's visit patient admitted to pain involving the greater trochanteric regions of considerable degree. The patient stated that she was considering undergoing interventional treatment for the severely disabling pain. The patient denied any trauma change in events of daily living the cost change in symptomatology. We discussed patient's condition including medications and will continue medication as prescribed at this time and we'll remain available to consider patient for additional modifications of treatment as discussed. At the present time patient will continue Tizanidine and oxycodone. Patient's insurance did not approve patient for Flector patch which we felt would be beneficial in terms of the greater trochanteric bursitis. We will continue to observe response to the present treatment and consider modifications of treatment as discussed and as explained to patient on today's visit. The patient was with understanding and agreement suggested treatment plan.  Review of Systems     Objective:   Physical Exam  There was tenderness of the splenius capitis and occipitalis musculature regions palpation which be produced pain of mild degree with mild tenderness over the cervical facet cervical paraspinal muscular region. Patient appeared to be with slightly decreased grip strength on the right compared to the left. There was tends to palpation over the region of the nuchal facet cervical paraspinal musculature region on the right greater than the left. Palpation over the thoracic facet thoracic paraspinal musculature region was attends to palpation of mild to moderate degree with no crepitus of the thoracic region  noted. There appeared to be unremarkable Spurling's maneuver. Palpation over the lumbar paraspinal musculatures and lumbar facet region was attends to palpation of mild to moderate degree with lateral bending rotation extension and palpation over the lumbar facets reproducing mild to moderate discomfort. There was tenderness over the PSIS and PII S region of mild to moderate degree. There was severe tenderness of the greater trochanteric region and iliotibial band regions palpation which reproduces severe disabling pain. Straight leg raising was limited to approximately 20 without increased pain with dorsiflexion noted. There was negative clonus negative Homans no sensory deficit of dermatomal distribution detected.. 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  F/U PCP Dr. Lennox Grumbles for evaliation of  BP diabetes mellitus and general medical  condition  F/U surgical evaluation. May consider pending follow-up evaluations  F/U neurological evaluation. May consider pending follow-up evaluations  May consider radiofrequency rhizolysis or intraspinal procedures pending response to present treatment and F/U evaluation   Patient to call Pain Management Center should patient have concerns prior to scheduled return appointment.

## 2015-04-11 NOTE — Patient Instructions (Signed)
PLAN   Continue present medication  Oxycodone and  Zanaflex.  F/U PCP Dr. Lennox Grumbles for evaliation of  BP diabetes mellitus and general medical  condition  F/U surgical evaluation. May consider pending follow-up evaluations  F/U neurological evaluation. May consider pending follow-up evaluations  May consider radiofrequency rhizolysis or intraspinal procedures pending response to present treatment and F/U evaluation   Patient to call Pain Management Center should patient have concerns prior to scheduled return appointment.

## 2015-04-11 NOTE — Progress Notes (Signed)
Safety precautions to be maintained throughout the outpatient stay will include: orient to surroundings, keep bed in low position, maintain call bell within reach at all times, provide assistance with transfer out of bed and ambulation.  

## 2015-05-11 ENCOUNTER — Ambulatory Visit: Payer: Medicare Other | Attending: Pain Medicine | Admitting: Pain Medicine

## 2015-05-11 ENCOUNTER — Encounter: Payer: Self-pay | Admitting: Pain Medicine

## 2015-05-11 VITALS — BP 130/79 | HR 86 | Temp 97.8°F | Resp 16 | Ht 65.0 in | Wt 243.0 lb

## 2015-05-11 DIAGNOSIS — M7060 Trochanteric bursitis, unspecified hip: Secondary | ICD-10-CM | POA: Diagnosis not present

## 2015-05-11 DIAGNOSIS — M4806 Spinal stenosis, lumbar region: Secondary | ICD-10-CM | POA: Insufficient documentation

## 2015-05-11 DIAGNOSIS — M5126 Other intervertebral disc displacement, lumbar region: Secondary | ICD-10-CM | POA: Diagnosis not present

## 2015-05-11 DIAGNOSIS — M5116 Intervertebral disc disorders with radiculopathy, lumbar region: Secondary | ICD-10-CM | POA: Diagnosis not present

## 2015-05-11 DIAGNOSIS — M542 Cervicalgia: Secondary | ICD-10-CM | POA: Diagnosis present

## 2015-05-11 DIAGNOSIS — M545 Low back pain: Secondary | ICD-10-CM | POA: Diagnosis present

## 2015-05-11 DIAGNOSIS — M533 Sacrococcygeal disorders, not elsewhere classified: Secondary | ICD-10-CM

## 2015-05-11 DIAGNOSIS — M47816 Spondylosis without myelopathy or radiculopathy, lumbar region: Secondary | ICD-10-CM

## 2015-05-11 DIAGNOSIS — M51369 Other intervertebral disc degeneration, lumbar region without mention of lumbar back pain or lower extremity pain: Secondary | ICD-10-CM

## 2015-05-11 DIAGNOSIS — E1340 Other specified diabetes mellitus with diabetic neuropathy, unspecified: Secondary | ICD-10-CM

## 2015-05-11 DIAGNOSIS — E1140 Type 2 diabetes mellitus with diabetic neuropathy, unspecified: Secondary | ICD-10-CM | POA: Diagnosis not present

## 2015-05-11 DIAGNOSIS — M5136 Other intervertebral disc degeneration, lumbar region: Secondary | ICD-10-CM

## 2015-05-11 MED ORDER — TIZANIDINE HCL 2 MG PO CAPS: ORAL_CAPSULE | ORAL | Status: DC

## 2015-05-11 MED ORDER — OXYCODONE HCL 10 MG PO TABS
ORAL_TABLET | ORAL | Status: DC
Start: 2015-05-11 — End: 2015-06-08

## 2015-05-11 NOTE — Progress Notes (Signed)
   Subjective:    Patient ID: Diana Bishop, female    DOB: May 14, 1955, 60 y.o.   MRN: [id number]  HPI  Patient is a 60 year old female who returns to pain management for further evaluation and treatment of pain involving the neck upper extremity regions lower back and lower extremity region. The patient continues medications without any undesirable side effects. The patient denies any significant change in activities of daily living or significant change in symptoms. The patient states that the pain is present during the day and that medications have been helpful in terms of reducing severity of the pain. Patient also has been able to obtain restful sleep without severely disabling pain. We will continue medications as prescribed and we'll remain available to consider interventional treatment as needed. The patient agreed to suggested treatment plan      Review of Systems     Objective:   Physical Exam  There was tends to palpation of paraspinal misreading cervical region cervical facet region of mild degree with mild to moderate tenderness of the splenius capitis and occipitalis musculature regions. There was slightly limited range of motion of the cervical spine with unremarkable Spurling's maneuver. Palpation of the cervical facet cervical paraspinal musculature region was attends to palpation of moderate degree with palpation of the thoracic facet thoracic paraspinal must reason reproducing moderate discomfort as well there was tenderness over the acromioclavicular and glenohumeral joint regions of mild degree. Tinel and Phalen's maneuver were without increased pain of significant degree patient was with decreased grip strength on the right compared to the left. Palpation over the lower thoracic region was evidence of muscle spasm moderate degree. There was tenderness of the PSIS and PII S region as well as the gluteal and piriformis musculature regions. Palpation over the PSIS and PII S  region reproduces moderately severe discomfort. Lateral bending rotation extension and palpation of the lumbar facets reproduced moderately severe discomfort. Straight leg raise was tolerates approximately 20 without increased pain with dorsiflexion noted. There was tenderness of moderate to moderately severe degree of the greater trochanteric region on the left as well as on the right. There was tenderness on the iliotibial band region of moderately severe degree as well. There was question decreased sensation of the lower extremities in a stocking-type distribution. There was negative clonus negative Homans. 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  F/U PCP Dr. Lennox Grumbles for evaliation of  BP diabetes mellitus and general medical  condition  F/U surgical evaluation. May consider pending follow-up evaluations  F/U neurological evaluation. May consider pending follow-up evaluations  Patient is to exercise with caution to avoid aggravation of symptomatology  May consider radiofrequency rhizolysis or intraspinal procedures pending response to present treatment and F/U evaluation   Patient to call Pain Management Center should patient have concerns prior to scheduled return appointment.

## 2015-05-11 NOTE — Patient Instructions (Addendum)
PLAN   Continue present medication  Oxycodone and  Zanaflex.  F/U PCP Dr. Lennox Grumbles for evaliation of  BP diabetes mellitus and general medical  condition  F/U surgical evaluation. May consider pending follow-up evaluations  F/U neurological evaluation. May consider pending follow-up evaluations  May consider radiofrequency rhizolysis or intraspinal procedures pending response to present treatment and F/U evaluation  Patient is to call pain management prior to appointment for any concerns

## 2015-05-11 NOTE — Progress Notes (Signed)
Safety precautions to be maintained throughout the outpatient stay will include: orient to surroundings, keep bed in low position, maintain call bell within reach at all times, provide assistance with transfer out of bed and ambulation.  

## 2015-06-08 ENCOUNTER — Ambulatory Visit: Payer: Medicare Other | Attending: Pain Medicine | Admitting: Pain Medicine

## 2015-06-08 ENCOUNTER — Encounter: Payer: Self-pay | Admitting: Pain Medicine

## 2015-06-08 VITALS — BP 146/87 | HR 89 | Temp 97.8°F | Resp 16 | Ht 65.0 in | Wt 240.0 lb

## 2015-06-08 DIAGNOSIS — M4806 Spinal stenosis, lumbar region: Secondary | ICD-10-CM | POA: Diagnosis not present

## 2015-06-08 DIAGNOSIS — M5126 Other intervertebral disc displacement, lumbar region: Secondary | ICD-10-CM | POA: Insufficient documentation

## 2015-06-08 DIAGNOSIS — E1140 Type 2 diabetes mellitus with diabetic neuropathy, unspecified: Secondary | ICD-10-CM | POA: Insufficient documentation

## 2015-06-08 DIAGNOSIS — M5136 Other intervertebral disc degeneration, lumbar region: Secondary | ICD-10-CM

## 2015-06-08 DIAGNOSIS — M7060 Trochanteric bursitis, unspecified hip: Secondary | ICD-10-CM

## 2015-06-08 DIAGNOSIS — M47816 Spondylosis without myelopathy or radiculopathy, lumbar region: Secondary | ICD-10-CM

## 2015-06-08 DIAGNOSIS — M542 Cervicalgia: Secondary | ICD-10-CM | POA: Diagnosis present

## 2015-06-08 DIAGNOSIS — M5116 Intervertebral disc disorders with radiculopathy, lumbar region: Secondary | ICD-10-CM | POA: Insufficient documentation

## 2015-06-08 DIAGNOSIS — M79606 Pain in leg, unspecified: Secondary | ICD-10-CM | POA: Diagnosis present

## 2015-06-08 DIAGNOSIS — E1340 Other specified diabetes mellitus with diabetic neuropathy, unspecified: Secondary | ICD-10-CM

## 2015-06-08 DIAGNOSIS — M545 Low back pain: Secondary | ICD-10-CM | POA: Diagnosis present

## 2015-06-08 DIAGNOSIS — M51369 Other intervertebral disc degeneration, lumbar region without mention of lumbar back pain or lower extremity pain: Secondary | ICD-10-CM

## 2015-06-08 DIAGNOSIS — M533 Sacrococcygeal disorders, not elsewhere classified: Secondary | ICD-10-CM

## 2015-06-08 MED ORDER — OXYCODONE HCL 10 MG PO TABS: ORAL_TABLET | ORAL | Status: DC

## 2015-06-08 MED ORDER — TIZANIDINE HCL 2 MG PO CAPS: ORAL_CAPSULE | ORAL | Status: DC

## 2015-06-08 NOTE — Patient Instructions (Signed)
PLAN   Continue present medication  Oxycodone and  Zanaflex.  F/U PCP Dr. Lennox Grumbles for evaliation of  BP diabetes mellitus and general medical  condition  F/U surgical evaluation. May consider pending follow-up evaluations area we will avoid surgical evaluation at this time  F/U neurological evaluation. May consider PNCV/EMG studies pending follow-up evaluations  May consider radiofrequency rhizolysis or intraspinal procedures pending response to present treatment and F/U evaluation  Patient is to call pain management prior to appointment for any concerns

## 2015-06-08 NOTE — Progress Notes (Signed)
   Subjective:    Patient ID: Gustavo Lah, female    DOB: 1955/12/30, 60 y.o.   MRN: [id number]  HPI    The patient is a 60 year old female who returns to pain management for further evaluation and treatment of pain involving the lower back lower extremity region with pain in the cervical region of lesser degree. The patient states that his pain is fairly well-controlled at this time. The patient states that she is looking forward to going on a cruise of her daughter. We discussed patient's condition and patient continues to be with significant lower back lower extremity pain with significant greater trochanteric bursitis and iliotibial band syndrome. We informed patient that we will remain available to consider patient for interventional treatment in attempt to decrease symptoms should patient wished to do such. We will continue patient's medication Zanaflex and oxycodone as prescribed at this time. The patient was with understanding and agreed to suggested treatment plan   Review of Systems     Objective:   Physical Exam   There was tends to palpation of the splenius capitis and occipitalis musculature region a mild to moderate degree. Patient was with tenderness of the cervical facet cervical paraspinal must reason a mild to moderate degree. Palpation of the acromial clavicular and glenohumeral joint regions reproduce moderate discomfort. There appeared to be unremarkable Spurling's maneuver. Palpation over the thoracic facet thoracic paraspinal must reason was attends to palpation of the lower thoracic region with no crepitus of the thoracic region noted. Moderate muscle spasms noted in the region of the thoracic region mid and lower regions. Palpation over the lumbar paraspinal must reason lumbar facet region was attends to palpation of moderate degree with lateral bending rotation extension and palpation over the lumbar facets reproducing moderate discomfort. Palpation of the PSIS and  PII S region reproduced moderate discomfort with moderate to moderately severe tenderness of the greater trochanteric region and iliotibial band region. Straight leg raising was limited to approximately 20 without a definite increase of pain with dorsiflexion noted. DTRs were difficult to elicit. There was negative clonus negative Homans. Abdomen nontender with no costovertebral tenderness noted.     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  F/U PCP Dr. Lennox Grumbles for evaliation of  BP diabetes mellitus and general medical  condition  F/U surgical evaluation. May consider pending follow-up evaluations area we will avoid surgical evaluation at this time  F/U neurological evaluation. May consider PNCV/EMG studies pending follow-up evaluations  May consider radiofrequency rhizolysis or intraspinal procedures pending response to present treatment and F/U evaluation  Patient is to call pain management prior to appointment for any concerns

## 2015-06-08 NOTE — Progress Notes (Signed)
Safety precautions to be maintained throughout the outpatient stay will include: orient to surroundings, keep bed in low position, maintain call bell within reach at all times, provide assistance with transfer out of bed and ambulation.  

## 2015-06-13 ENCOUNTER — Other Ambulatory Visit: Payer: Self-pay | Admitting: Pain Medicine

## 2015-07-11 ENCOUNTER — Encounter: Payer: Self-pay | Admitting: Pain Medicine

## 2015-07-11 ENCOUNTER — Ambulatory Visit: Payer: Medicare Other | Attending: Pain Medicine | Admitting: Pain Medicine

## 2015-07-11 VITALS — BP 141/88 | HR 89 | Temp 97.9°F | Resp 18 | Ht 65.0 in | Wt 242.0 lb

## 2015-07-11 DIAGNOSIS — M5126 Other intervertebral disc displacement, lumbar region: Secondary | ICD-10-CM | POA: Diagnosis not present

## 2015-07-11 DIAGNOSIS — M542 Cervicalgia: Secondary | ICD-10-CM | POA: Diagnosis present

## 2015-07-11 DIAGNOSIS — M533 Sacrococcygeal disorders, not elsewhere classified: Secondary | ICD-10-CM | POA: Insufficient documentation

## 2015-07-11 DIAGNOSIS — M7060 Trochanteric bursitis, unspecified hip: Secondary | ICD-10-CM | POA: Diagnosis not present

## 2015-07-11 DIAGNOSIS — E1340 Other specified diabetes mellitus with diabetic neuropathy, unspecified: Secondary | ICD-10-CM

## 2015-07-11 DIAGNOSIS — M47816 Spondylosis without myelopathy or radiculopathy, lumbar region: Secondary | ICD-10-CM

## 2015-07-11 DIAGNOSIS — M5116 Intervertebral disc disorders with radiculopathy, lumbar region: Secondary | ICD-10-CM | POA: Insufficient documentation

## 2015-07-11 DIAGNOSIS — M5136 Other intervertebral disc degeneration, lumbar region: Secondary | ICD-10-CM

## 2015-07-11 DIAGNOSIS — M545 Low back pain: Secondary | ICD-10-CM | POA: Diagnosis present

## 2015-07-11 DIAGNOSIS — E1140 Type 2 diabetes mellitus with diabetic neuropathy, unspecified: Secondary | ICD-10-CM | POA: Diagnosis not present

## 2015-07-11 DIAGNOSIS — M4806 Spinal stenosis, lumbar region: Secondary | ICD-10-CM | POA: Insufficient documentation

## 2015-07-11 DIAGNOSIS — M79606 Pain in leg, unspecified: Secondary | ICD-10-CM | POA: Diagnosis present

## 2015-07-11 MED ORDER — DICLOFENAC SODIUM 1 % TD GEL: TRANSDERMAL | Status: DC

## 2015-07-11 MED ORDER — TIZANIDINE HCL 2 MG PO TABS: ORAL_TABLET | ORAL | Status: DC

## 2015-07-11 MED ORDER — TIZANIDINE HCL 2 MG PO CAPS: ORAL_CAPSULE | ORAL | Status: DC

## 2015-07-11 MED ORDER — OXYCODONE HCL 10 MG PO TABS: ORAL_TABLET | ORAL | Status: DC

## 2015-07-11 NOTE — Progress Notes (Signed)
   Subjective:    Patient ID: Diana Bishop, female    DOB: 16-May-1955, 60 y.o.   MRN: [id number]  HPI  The patient is a 60 year old female who returns to pain management for further evaluation and treatment of pain involving the neck upper extremity region as well as the lower back and lower extremity regions. The patient is excited due to fact that she is going on a cruise and May. The patient denies any trauma change in events of daily living the call significant change in symptomatology. We discussed patient's overall condition and will continue Zanaflex Voltaren gel and oxycodone. We will avoid interventional treatment at this time. The patient states the pain is aggravated by standing walking twisting turning maneuvers and became pain becomes more intense as the day progresses. We will remain available to consider patient for interventional treatment should there be significant pain of significant response to non-interventional treatment measures. The patient was in agreement with suggested treatment plan.  Review of Systems     Objective:   Physical Exam  There was tenderness to palpation of paraspinal musculature in the cervical region cervical facet region palpation which be produced mild to moderate degree discomfort. There was tenderness to palpation of the splenius capitis and occipitalis musculature region of mild to moderate degree with mild to moderate tenderness to palpation over the cervical facet as well as the thoracic facet region with no crepitus of the thoracic region noted. There appeared to be unremarkable portable Spurling's maneuver and patient was with slightly decreased grip strength. Patient was able to perform drop test with mild difficulty. The patient appeared to be with slightly decreased grip strength with Tinel and Phalen's maneuver reproducing minimal discomfort. There was evidence of moderate muscle spasm of the lower thoracic paraspinal musculature region.  Palpation over the lumbar paraspinal musculatures and lumbar facet region was attends to palpation of moderate degree with lateral bending rotation extension and palpation of the lumbar facets reproducing moderate discomfort. There was moderate to moderately severe tenderness of the greater trochanteric region and iliotibial band region. Straight leg raising was decreased and tolerates approximately 20 without a definite increased pain with dorsiflexion noted. There was negative clonus negative Homans. Knees were attends to palpation. 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  F/U PCP Dr. Lennox Grumbles for evaliation of  BP diabetes mellitus and general medical  condition  F/U surgical evaluation. May consider pending follow-up evaluations area we will avoid surgical evaluation at this time  F/U neurological evaluation. May consider PNCV/EMG studies pending follow-up evaluations  May consider radiofrequency rhizolysis or intraspinal procedures pending response to present treatment and F/U evaluation  Patient is to call pain management prior to appointment for any concerns

## 2015-07-11 NOTE — Patient Instructions (Addendum)
PLAN   Continue present medication  Oxycodone Voltaren Gel and  Zanaflex.  F/U PCP Dr. Lennox Grumbles for evaliation of  BP diabetes mellitus and general medical  condition  F/U surgical evaluation. May consider pending follow-up evaluations area we will avoid surgical evaluation at this time  F/U neurological evaluation. May consider PNCV/EMG studies pending follow-up evaluations  May consider radiofrequency rhizolysis or intraspinal procedures pending response to present treatment and F/U evaluation  Patient is to call pain management prior to appointment for any concerns

## 2015-07-11 NOTE — Progress Notes (Signed)
Safety precautions to be maintained throughout the outpatient stay will include: orient to surroundings, keep bed in low position, maintain call bell within reach at all times, provide assistance with transfer out of bed and ambulation.  

## 2015-08-01 ENCOUNTER — Ambulatory Visit (INDEPENDENT_AMBULATORY_CARE_PROVIDER_SITE_OTHER): Payer: Medicare Other

## 2015-08-01 ENCOUNTER — Ambulatory Visit (INDEPENDENT_AMBULATORY_CARE_PROVIDER_SITE_OTHER): Payer: Medicare Other | Admitting: Podiatry

## 2015-08-01 ENCOUNTER — Encounter: Payer: Self-pay | Admitting: Podiatry

## 2015-08-01 VITALS — BP 133/84 | HR 93 | Resp 18

## 2015-08-01 DIAGNOSIS — R52 Pain, unspecified: Secondary | ICD-10-CM | POA: Diagnosis not present

## 2015-08-01 DIAGNOSIS — M722 Plantar fascial fibromatosis: Secondary | ICD-10-CM

## 2015-08-01 MED ORDER — MELOXICAM 7.5 MG PO TABS: 7.5000 mg | ORAL_TABLET | Freq: Every day | ORAL | Status: DC

## 2015-08-01 NOTE — Progress Notes (Signed)
   Subjective:    Patient ID: Diana Bishop, female    DOB: Jun 28, 1955, 60 y.o.   MRN: [id number]  HPI  60 year old female presents the office today for concerns of left heel pain which has been ongoing for about 1 month. She states that she has pain if she sits down for some time and gets back up. She denies any recent injury or trauma. She has been using some cream over the area which has not been helping. She said no other treatment. No recent injury. No increase or change in tingling or numbness although she does have neuropathy. Her last A1c was 7.5. No other complaints.   Review of Systems  All other systems reviewed and are negative.      Objective:   Physical Exam General: AAO x3, NAD  Dermatological: Skin is warm, dry and supple bilateral. Nails x 10 are well manicured; remaining integument appears unremarkable at this time. There are no open sores, no preulcerative lesions, no rash or signs of infection present.  Vascular: Dorsalis Pedis artery and Posterior Tibial artery pedal pulses are 2/4 bilateral with immedate capillary fill time. Pedal hair growth present. No varicosities and no lower extremity edema present bilateral. There is no pain with calf compression, swelling, warmth, erythema.   Neruologic: Grossly intact via light touch bilateral. Vibratory intact via tuning fork bilateral. Protective threshold with Semmes Wienstein monofilament intact to all pedal sites bilateral. Patellar and Achilles deep tendon reflexes 2+ bilateral. No Babinski or clonus noted bilateral.   Musculoskeletal: Tenderness to palpation along the plantar medial tubercle of the calcaneus at the insertion of plantar fascia on the left foot. There is no pain along the course of the plantar fascia within the arch of the foot. Plantar fascia appears to be intact. There is no pain with lateral compression of the calcaneus or pain with vibratory sensation. There is no pain along the course or insertion of  the achilles tendon. No other areas of tenderness to bilateral lower extremities.  Gait: Unassisted, Nonantalgic.       Assessment & Plan:  60 year old female left heel pain, likely plantar fasciitis. -Treatment options discussed including all alternatives, risks, and complications -Etiology of symptoms were discussed -X-rays were obtained and reviewed with the patient. No evidence of acute fracture. Evidence of flatfoot. -Patient elects to proceed with steroid injection into the left heel. Under sterile skin preparation, a total of 2.5cc of kenalog 10, 0.5% Marcaine plain, and 2% lidocaine plain were infiltrated into the symptomatic area without complication. A band-aid was applied. Patient tolerated the injection well without complication. Post-injection care with discussed with the patient. Discussed with the patient to ice the area over the next couple of days to help prevent a steroid flare. Monitor blood sugar. -Plantar fascia brace -Ice -Stretching -Prescribed mobic. Discussed side effects of the medication and directed to stop if any are to occur and call the office.  -Shoegear changes -Follow-up in 3 weeks or sooner if any problems arise. In the meantime, encouraged to call the office with any questions, concerns, change in symptoms.   Celesta Gentile, DPM

## 2015-08-01 NOTE — Patient Instructions (Signed)

## 2015-08-08 ENCOUNTER — Ambulatory Visit: Payer: Medicare Other | Admitting: Pain Medicine

## 2015-08-09 ENCOUNTER — Encounter: Payer: Self-pay | Admitting: Pain Medicine

## 2015-08-09 ENCOUNTER — Ambulatory Visit: Payer: Medicare Other | Attending: Pain Medicine | Admitting: Pain Medicine

## 2015-08-09 VITALS — BP 160/97 | HR 89 | Temp 98.0°F | Resp 16 | Ht 65.0 in | Wt 242.0 lb

## 2015-08-09 DIAGNOSIS — M5136 Other intervertebral disc degeneration, lumbar region: Secondary | ICD-10-CM

## 2015-08-09 DIAGNOSIS — M5116 Intervertebral disc disorders with radiculopathy, lumbar region: Secondary | ICD-10-CM | POA: Diagnosis not present

## 2015-08-09 DIAGNOSIS — M533 Sacrococcygeal disorders, not elsewhere classified: Secondary | ICD-10-CM | POA: Diagnosis not present

## 2015-08-09 DIAGNOSIS — M546 Pain in thoracic spine: Secondary | ICD-10-CM | POA: Diagnosis present

## 2015-08-09 DIAGNOSIS — M7060 Trochanteric bursitis, unspecified hip: Secondary | ICD-10-CM | POA: Insufficient documentation

## 2015-08-09 DIAGNOSIS — M5126 Other intervertebral disc displacement, lumbar region: Secondary | ICD-10-CM | POA: Diagnosis not present

## 2015-08-09 DIAGNOSIS — M79606 Pain in leg, unspecified: Secondary | ICD-10-CM | POA: Diagnosis present

## 2015-08-09 DIAGNOSIS — M542 Cervicalgia: Secondary | ICD-10-CM | POA: Diagnosis present

## 2015-08-09 DIAGNOSIS — E1140 Type 2 diabetes mellitus with diabetic neuropathy, unspecified: Secondary | ICD-10-CM | POA: Insufficient documentation

## 2015-08-09 DIAGNOSIS — M47816 Spondylosis without myelopathy or radiculopathy, lumbar region: Secondary | ICD-10-CM

## 2015-08-09 DIAGNOSIS — M722 Plantar fascial fibromatosis: Secondary | ICD-10-CM | POA: Diagnosis not present

## 2015-08-09 DIAGNOSIS — M4806 Spinal stenosis, lumbar region: Secondary | ICD-10-CM | POA: Diagnosis not present

## 2015-08-09 DIAGNOSIS — E1340 Other specified diabetes mellitus with diabetic neuropathy, unspecified: Secondary | ICD-10-CM

## 2015-08-09 MED ORDER — TIZANIDINE HCL 2 MG PO CAPS: ORAL_CAPSULE | ORAL | Status: DC

## 2015-08-09 MED ORDER — TIZANIDINE HCL 2 MG PO CAPS
ORAL_CAPSULE | ORAL | Status: DC
Start: 2015-08-09 — End: 2015-09-07

## 2015-08-09 MED ORDER — OXYCODONE HCL 10 MG PO TABS: ORAL_TABLET | ORAL | Status: DC

## 2015-08-09 MED ORDER — DICLOFENAC SODIUM 1 % TD GEL
TRANSDERMAL | Status: DC
Start: 2015-08-09 — End: 2015-11-02

## 2015-08-09 NOTE — Patient Instructions (Addendum)
PLAN   Continue present medication  Oxycodone Voltaren Gel and  Zanaflex.  F/U PCP Dr. Lennox Grumbles for evaliation of  BP diabetes mellitus and general medical  condition  F/U surgical evaluation. May consider pending follow-up evaluations area we will avoid surgical evaluation at this time  F/U neurological evaluation. May consider PNCV/EMG studies pending follow-up evaluations  F/U with podiatrist Dr. Earleen Newport for evaluation and treatment of plantar fasciitis as discussed  May consider radiofrequency rhizolysis or intraspinal procedures pending response to present treatment and F/U evaluation  Patient is to call pain management prior to appointment for any concerns Pain Management Discharge Instructions  General Discharge Instructions :  If you need to reach your doctor call: Monday-Friday 8:00 am - 4:00 pm at [phone] or toll free [phone].  After clinic hours [phone] to have operator reach doctor.  Bring all of your medication bottles to all your appointments in the pain clinic.  To cancel or reschedule your appointment with Pain Management please remember to call 24 hours in advance to avoid a fee.  Refer to the educational materials which you have been given on: General Risks, I had my Procedure. Discharge Instructions, Post Sedation.  Post Procedure Instructions:  The drugs you were given will stay in your system until tomorrow, so for the next 24 hours you should not drive, make any legal decisions or drink any alcoholic beverages.  You may eat anything you prefer, but it is better to start with liquids then soups and crackers, and gradually work up to solid foods.  Please notify your doctor immediately if you have any unusual bleeding, trouble breathing or pain that is not related to your normal pain.  Depending on the type of procedure that was done, some parts of your body may feel week and/or numb.  This usually clears up by tonight or the next day.  Walk with  the use of an assistive device or accompanied by an adult for the 24 hours.  You may use ice on the affected area for the first 24 hours.  Put ice in a Ziploc bag and cover with a towel and place against area 15 minutes on 15 minutes off.  You may switch to heat after 24 hours.

## 2015-08-09 NOTE — Progress Notes (Signed)
Safety precautions to be maintained throughout the outpatient stay will include: orient to surroundings, keep bed in low position, maintain call bell within reach at all times, provide assistance with transfer out of bed and ambulation.  

## 2015-08-09 NOTE — Progress Notes (Signed)
   Subjective:    Patient ID: Diana Bishop, female    DOB: 07/01/1955, 60 y.o.   MRN: [id number]  HPI  The patient is a 60 year old female who returns to pain management for further evaluation and treatment of pain involving the region of the neck entire back upper and lower extremity regions. The patient stated that she was able to go on her vacation without significant pain. The patient did undergo injection of the left foot for plantar fasciitis prior to her trip. The patient states that she continues to be with pain fairly well-controlled at this time. The patient continues medications consisting of Zanaflex oxycodone and Voltaren gel area we will continue medications as prescribed this time. The patient denies any trauma change in events of daily living the call significant change in symptomatology except that she did do a rather extensive walking on her vacation. We will remain available to consider patient for modification of treatment regimen including interventional treatment should there be a return of any significant pain. All were understanding and agreement suggested treatment plan     Review of Systems     Objective:   Physical Exam  There was mild to moderate tenderness of the splenius capitis and occipitalis musculature region palpation which reproduces pain of mild-to-moderate degree. Patient over the cervical facet cervical paraspinal musculature region and thoracic facet thoracic paraspinal musculature region was with moderate discomfort. No crepitus of the thoracic region was noted. The patient was with mild to moderate tenderness of the acromioclavicular and glenohumeral joint region and appeared to be unremarkable Spurling's maneuver. The patient was with out significant increase of pain with Tinel and Phalen's maneuver and appeared to be with decreased grip strength.. There was tenderness over the lumbar paraspinal muscles lumbar facet region a moderate degree with  lateral bending rotation extension and palpation of the lumbar facets reproducing moderate discomfort. There was moderate tenderness over the PSIS and PII S region as well as the greater trochanteric region and iliotibial band region. Straight leg raise was tolerates approximately 20 there was negative Homans. 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  F/U PCP Dr. Lennox Grumbles for evaliation of  BP diabetes mellitus and general medical  condition  F/U surgical evaluation. May consider pending follow-up evaluations area we will avoid surgical evaluation at this time  F/U neurological evaluation. May consider PNCV/EMG studies pending follow-up evaluations  F/U with podiatrist Dr. Earleen Newport for evaluation and treatment of plantar fasciitis as discussed  May consider radiofrequency rhizolysis or intraspinal procedures pending response to present treatment and F/U evaluation  Patient is to call pain management prior to appointment for any concerns

## 2015-08-10 ENCOUNTER — Ambulatory Visit: Payer: Medicare Other | Admitting: Pain Medicine

## 2015-08-22 ENCOUNTER — Encounter: Payer: Self-pay | Admitting: Podiatry

## 2015-08-22 ENCOUNTER — Ambulatory Visit (INDEPENDENT_AMBULATORY_CARE_PROVIDER_SITE_OTHER): Payer: Medicare Other | Admitting: Podiatry

## 2015-08-22 DIAGNOSIS — M722 Plantar fascial fibromatosis: Secondary | ICD-10-CM

## 2015-08-22 NOTE — Progress Notes (Signed)
Patient ID: Diana Bishop, female   DOB: Dec 08, 1955, 60 y.o.   MRN: [id number]  Subjective: 60 year old female presents the office today for follow up evaluation pain. She states that she is doing much better although she still gets some discomfort to the area. She went [place] since I saw her last and did well however since she came back from vacation she started having recurrence of some pain. She has been stretching icing as well as the plantar fascial brace. No acute changes.  Objective: AAO x3, NAD DP/PT pulses palpable bilaterally, CRT less than 3 seconds There is continued, but improved tenderness to palpation along the plantar medial tubercle of the calcaneus at the insertion of plantar fascia on the left  foot. There is no pain along the course of the plantar fascia within the arch of the foot. Plantar fascia appears to be intact. There is no pain with lateral compression of the calcaneus or pain with vibratory sensation. There is no pain along the course or insertion of the achilles tendon. No other areas of tenderness to bilateral lower extremities. No areas of pinpoint bony tenderness or pain with vibratory sensation. MMT 5/5, ROM WNL. No edema, erythema, increase in warmth to bilateral lower extremities.  No open lesions or pre-ulcerative lesions.  No pain with calf compression, swelling, warmth, erythema  Assessment: Left foot resolving heel pain, likely plantar fasciitis  Plan: -All treatment options discussed with the patient including all alternatives, risks, complications.  -Patient elects to proceed with steroid injection into the left heel. Under sterile skin preparation, a total of 2.5cc of kenalog 10, 0.5% Marcaine plain, and 2% lidocaine plain were infiltrated into the symptomatic area without complication. A band-aid was applied. Patient tolerated the injection well without complication. Post-injection care with discussed with the patient. Discussed with the  patient to ice the area over the next couple of days to help prevent a steroid flare.  -Dispensed night splint -Continue stretching, icing -Discussed shoe gear modifications -Discussed orthotics -Follow-up 3 weeks -Patient encouraged to call the office with any questions, concerns, change in symptoms.   Celesta Gentile, DPM

## 2015-09-07 ENCOUNTER — Encounter: Payer: Self-pay | Admitting: Pain Medicine

## 2015-09-07 ENCOUNTER — Ambulatory Visit: Payer: Medicare Other | Attending: Pain Medicine | Admitting: Pain Medicine

## 2015-09-07 VITALS — BP 133/65 | HR 67 | Temp 97.3°F | Resp 16 | Ht 65.0 in | Wt 240.0 lb

## 2015-09-07 DIAGNOSIS — M7060 Trochanteric bursitis, unspecified hip: Secondary | ICD-10-CM | POA: Diagnosis not present

## 2015-09-07 DIAGNOSIS — M5126 Other intervertebral disc displacement, lumbar region: Secondary | ICD-10-CM | POA: Insufficient documentation

## 2015-09-07 DIAGNOSIS — M722 Plantar fascial fibromatosis: Secondary | ICD-10-CM | POA: Insufficient documentation

## 2015-09-07 DIAGNOSIS — M47816 Spondylosis without myelopathy or radiculopathy, lumbar region: Secondary | ICD-10-CM

## 2015-09-07 DIAGNOSIS — E1140 Type 2 diabetes mellitus with diabetic neuropathy, unspecified: Secondary | ICD-10-CM | POA: Insufficient documentation

## 2015-09-07 DIAGNOSIS — M533 Sacrococcygeal disorders, not elsewhere classified: Secondary | ICD-10-CM | POA: Diagnosis not present

## 2015-09-07 DIAGNOSIS — M79604 Pain in right leg: Secondary | ICD-10-CM | POA: Diagnosis present

## 2015-09-07 DIAGNOSIS — E1340 Other specified diabetes mellitus with diabetic neuropathy, unspecified: Secondary | ICD-10-CM

## 2015-09-07 DIAGNOSIS — M5136 Other intervertebral disc degeneration, lumbar region: Secondary | ICD-10-CM

## 2015-09-07 DIAGNOSIS — M79605 Pain in left leg: Secondary | ICD-10-CM | POA: Diagnosis present

## 2015-09-07 DIAGNOSIS — M545 Low back pain: Secondary | ICD-10-CM | POA: Diagnosis present

## 2015-09-07 DIAGNOSIS — M4806 Spinal stenosis, lumbar region: Secondary | ICD-10-CM | POA: Insufficient documentation

## 2015-09-07 DIAGNOSIS — M5116 Intervertebral disc disorders with radiculopathy, lumbar region: Secondary | ICD-10-CM | POA: Diagnosis not present

## 2015-09-07 MED ORDER — TIZANIDINE HCL 2 MG PO CAPS: ORAL_CAPSULE | ORAL | Status: DC

## 2015-09-07 MED ORDER — DICLOFENAC SODIUM 1 % TD GEL: TRANSDERMAL | Status: DC

## 2015-09-07 MED ORDER — OXYCODONE HCL 10 MG PO TABS: ORAL_TABLET | ORAL | Status: DC

## 2015-09-07 NOTE — Patient Instructions (Addendum)
PLAN   Continue present medication  Oxycodone Voltaren Gel and  Zanaflex.  F/U PCP Dr. Lennox Grumbles for evaliation of  BP diabetes mellitus plantar fasciitis and pain medication for plantar fasciitis. Avoid nonsteroidal anti-inflammatory medications due to your diabetes  F/U surgical evaluation. May consider pending follow-up evaluations area we will avoid surgical evaluation at this time  F/U neurological evaluation. May consider PNCV/EMG studies pending follow-up evaluations  F/U with podiatrist Dr. Earleen Newport for evaluation and treatment of plantar fasciitis as discussed  Consider wearing cervical collar when sleeping and try to maintain proper positioning of the neck as discussed to avoid paresthesias of the upper extremity  May consider radiofrequency rhizolysis or intraspinal procedures pending response to present treatment and F/U evaluation  Patient is to call pain management prior to appointment for any concerns

## 2015-09-07 NOTE — Progress Notes (Signed)
Safety precautions to be maintained throughout the outpatient stay will include: orient to surroundings, keep bed in low position, maintain call bell within reach at all times, provide assistance with transfer out of bed and ambulation.  

## 2015-09-07 NOTE — Progress Notes (Signed)
Subjective:    Patient ID: Diana Bishop, female    DOB: [birth year], 60 y.o.   MRN: [id number]  HPI  Patient is a 60 year old female who returns to pain management for further evaluation and treatment of pain involving the region of the upper mid lower back and lower extremity regions. The patient states that she enjoyed her vacation and that she was able to perform most activities at time of vacation without experiencing any severe disabling pain. The patient returns today for follow-up evaluation of her condition and states the pain is fairly well-controlled at this time. The patient denies any recent trauma change in events of daily living the call significant change in symptomatology. The patient has undergone injection for plantar fasciitis involving the region of the left foot. The patient also is wearing brace of the left foot to Avoid aggravation of plantar fasciitis. The patient also states that she has some pain involving the neck with some upper extremity discomfort. We discuss the need for patient to sleep with the neck being maintained in proper alignment with without evidence of flexion extension lateral bending rotation to avoid paresthesias of the upper extremity as well as pain involving the neck We will consider patient for additional modifications of treatment regimen including interventional treatment pending follow-up evaluations. At the present time patient continues her medications consisting of Zanaflex oxycodone and Voltaren gel. We remain upright visit patient for additional modifications of treatment regimen pending response to present treatment and follow-up evaluation. All agreed to suggested treatment plan     Review of Systems     Objective:   Physical Exam  There was mild tenderness of the splenius capitis and occipitalis regions palpation of his reproduce mild discomfort on the left as well as on the right. Palpation over the cervical facet cervical paraspinal  musculature region was attends to palpation of mild to moderate degree and patient was with slightly limited range of motion of the cervical spine with unremarkable. There was no definite radiation of pain to the upper extremities with range of motion maneuvers of the cervical spine. Spurling's maneuver. There was tenderness over the region of the acromioclavicular and glenohumeral joint regions of moderate degree. Tinel and Phalen's maneuver were without increased pain of significant degree and patient appeared to be with bilaterally equal grip strength. Palpation over the thoracic region thoracic facet region was attends to palpation of moderate degree the lower thoracic region with no crepitus of the thoracic region noted. Extension and palpation of the lumbar facets reproduced moderately severe discomfort. There was tenderness over the PSIS and PII S region a moderate degree with moderate severe tenderness to palpation of the greater trochanteric region iliotibial band region. Straight leg raise was tolerates approximately 20 without increased pain with dorsiflexion noted. There appeared to be decreased sensation of the lower extremities in a stocking-type distribution. The patient was with brace of the left ankle and was with slightly decreased EHL strength on the left compared to the right. There was negative clonus negative Homans. 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  F/U PCP Dr. Lennox Grumbles for evaliation of  BP diabetes mellitus plantar fasciitis and pain medication for plantar fasciitis. Avoid nonsteroidal anti-inflammatory medications due to your diabetes  F/U surgical evaluation. May consider pending follow-up evaluations area we will avoid surgical evaluation at this time  F/U neurological evaluation. May consider PNCV/EMG studies pending follow-up evaluations  F/U with podiatrist Dr. Earleen Newport for evaluation and treatment of plantar fasciitis as discussed  Consider wearing cervical collar when sleeping and try to maintain proper positioning of the neck as discussed to avoid paresthesias of the upper extremity  May consider radiofrequency rhizolysis or intraspinal procedures pending response to present treatment and F/U evaluation  Patient is to call pain management prior to appointment for any concerns

## 2015-09-26 ENCOUNTER — Encounter: Payer: Self-pay | Admitting: Podiatry

## 2015-09-26 ENCOUNTER — Ambulatory Visit (INDEPENDENT_AMBULATORY_CARE_PROVIDER_SITE_OTHER): Payer: Medicare Other | Admitting: Podiatry

## 2015-09-26 VITALS — BP 117/80 | HR 90 | Resp 12

## 2015-09-26 DIAGNOSIS — M722 Plantar fascial fibromatosis: Secondary | ICD-10-CM | POA: Diagnosis not present

## 2015-09-26 MED ORDER — MELOXICAM 7.5 MG PO TABS: 7.5000 mg | ORAL_TABLET | Freq: Every day | ORAL | Status: DC

## 2015-09-26 NOTE — Progress Notes (Signed)
Patient ID: Diana Bishop, female   DOB: 03-Jun-1955, 61 y.o.   MRN: [id number]  Subjective: 60 year old female presents the office they for follow-up evaluation of left heel pain. She says the pain has been better however still sore in one spot. She is doing much when she didn't my first evaluated her. She has been stretching icing intermittently as well as using the night splint rarely. She has changed her shoes some but does not helped. She has not tried inserts. She has been fitted for diabetic inserts apparently.Denies any systemic complaints such as fevers, chills, nausea, vomiting. No acute changes since last appointment, and no other complaints at this time.   Objective: AAO x3, NAD DP/PT pulses palpable bilaterally, CRT less than 3 seconds There is decreased yet continued mild tenderness to palpation along the plantar medial tubercle of the calcaneus at the insertion of plantar fascia on the left foot. There is no pain along the course of the plantar fascia within the arch of the foot. Plantar fascia appears to be intact. There is no pain with lateral compression of the calcaneus or pain with vibratory sensation. There is no pain along the course or insertion of the achilles tendon. No other areas of tenderness to bilateral lower extremities. No areas of pinpoint bony tenderness or pain with vibratory sensation. MMT 5/5, ROM WNL. No edema, erythema, increase in warmth to bilateral lower extremities.  No open lesions or pre-ulcerative lesions.  No pain with calf compression, swelling, warmth, erythema  Assessment: Resolving plantar fasciitis with continued symptoms however.  Plan: -All treatment options discussed with the patient including all alternatives, risks, complications.  -Discussed third steroid injection but she was told off. -Continue with anti-inflammatory medicine. Mobic prn - plantar fascial taping was applied -Upon valuation of her shoes are very flattened providing no  support she has a flatfoot. I discussed with her orthotics on her shoes and she was more arch support. -Continue plantar fascial braces -Follow up in 4 weeks. -Patient encouraged to call the office with any questions, concerns, change in symptoms.   Celesta Gentile, DPM

## 2015-10-05 ENCOUNTER — Ambulatory Visit: Payer: Medicare Other | Attending: Pain Medicine | Admitting: Pain Medicine

## 2015-10-05 ENCOUNTER — Encounter: Payer: Self-pay | Admitting: Pain Medicine

## 2015-10-05 VITALS — BP 144/98 | HR 71 | Temp 98.1°F | Resp 16 | Ht 65.0 in | Wt 240.0 lb

## 2015-10-05 DIAGNOSIS — M4806 Spinal stenosis, lumbar region: Secondary | ICD-10-CM | POA: Insufficient documentation

## 2015-10-05 DIAGNOSIS — M542 Cervicalgia: Secondary | ICD-10-CM | POA: Diagnosis present

## 2015-10-05 DIAGNOSIS — M533 Sacrococcygeal disorders, not elsewhere classified: Secondary | ICD-10-CM | POA: Diagnosis not present

## 2015-10-05 DIAGNOSIS — M5116 Intervertebral disc disorders with radiculopathy, lumbar region: Secondary | ICD-10-CM | POA: Diagnosis not present

## 2015-10-05 DIAGNOSIS — M7060 Trochanteric bursitis, unspecified hip: Secondary | ICD-10-CM | POA: Diagnosis not present

## 2015-10-05 DIAGNOSIS — M5126 Other intervertebral disc displacement, lumbar region: Secondary | ICD-10-CM | POA: Insufficient documentation

## 2015-10-05 DIAGNOSIS — M546 Pain in thoracic spine: Secondary | ICD-10-CM | POA: Diagnosis present

## 2015-10-05 DIAGNOSIS — E1340 Other specified diabetes mellitus with diabetic neuropathy, unspecified: Secondary | ICD-10-CM

## 2015-10-05 DIAGNOSIS — M62830 Muscle spasm of back: Secondary | ICD-10-CM | POA: Insufficient documentation

## 2015-10-05 DIAGNOSIS — M47816 Spondylosis without myelopathy or radiculopathy, lumbar region: Secondary | ICD-10-CM

## 2015-10-05 DIAGNOSIS — E1140 Type 2 diabetes mellitus with diabetic neuropathy, unspecified: Secondary | ICD-10-CM | POA: Insufficient documentation

## 2015-10-05 DIAGNOSIS — M5136 Other intervertebral disc degeneration, lumbar region: Secondary | ICD-10-CM

## 2015-10-05 DIAGNOSIS — M722 Plantar fascial fibromatosis: Secondary | ICD-10-CM | POA: Diagnosis not present

## 2015-10-05 DIAGNOSIS — M51369 Other intervertebral disc degeneration, lumbar region without mention of lumbar back pain or lower extremity pain: Secondary | ICD-10-CM

## 2015-10-05 MED ORDER — OXYCODONE HCL 10 MG PO TABS: ORAL_TABLET | ORAL | Status: DC

## 2015-10-05 MED ORDER — DICLOFENAC SODIUM 1 % TD GEL: TRANSDERMAL | Status: AC

## 2015-10-05 MED ORDER — DICLOFENAC EPOLAMINE 1.3 % TD PTCH: 1.0000 | MEDICATED_PATCH | Freq: Two times a day (BID) | TRANSDERMAL | Status: DC

## 2015-10-05 MED ORDER — TIZANIDINE HCL 2 MG PO CAPS: ORAL_CAPSULE | ORAL | Status: DC

## 2015-10-05 NOTE — Progress Notes (Signed)
Subjective:    Patient ID: Diana Bishop, female    DOB: 04-Mar-1956, 59 y.o.   MRN: [id number]  HPI   The patient is a 60 year old female who returns to pain management for further evaluation and treatment of pain involving the region of the neck entire back upper and lower extremity regions. Patient is undergone recent treatment for pain involving the region of the foot. The patient is felt to be with component of plantar fasciitis. Patient also has significant pain of the lumbar region lower extremity region with greater trochanteric bursitis as well as which appear to be contributing to patient's symptoms. We will discuss patient's condition on today's visit and patient made some pain involving the hand in the area of the base of the thumb. The patient will apply Voltaren Gel to the base of thumb of the right hand at this time. We will observe response to treatment and will consider patient for additional modifications of treatment pending follow-up evaluation. Patient denied any trauma change in events sedated him to cause change in symptomatology. Patient states she is looking forward to being able to attend family reunion next month. We will continue oxycodone as prescribed as well as Zanaflex and Voltaren Gel   . The patient is call should there be significant change in condition prior to scheduled return appointment.    Review of Systems     Objective:   Physical Exam   There was tenderness of the splenius capitis and occipitalis region palpation which reproduces mild discomfort with mild tenderness over the cervical facet cervical paraspinal musculature region. There was mild tenderness over the thoracic facet thoracic paraspinal musculature region. Palpation of the acromioclavicular and glenohumeral joint regions reproduce mild discomfort to moderate discomfort. The patient was with slightly decreased grip strength without increased pain of significant degree with Tinel and  Phalen's maneuver. Palpation over the lower thoracic region was evidence of muscle spasm moderate degree. Palpation over the lumbar paraspinal musculatures and lumbar facet region was attends to palpation of moderate degree with lateral bending rotation extension and palpation of the lumbar facets reproducing moderate discomfort. There was moderate to moderately severe tenderness of the greater trochanteric region iliotibial band region. Palpation over the PSIS and PII S region reproduces moderate discomfort. Leg raising was limited to approximately 20 without increase of pain with dorsiflexion noted. DTRs were difficult to elicit. There was negative Homans. 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 You may apply Voltaren Gel to the area of the hand as discussed   F/U PCP Dr. Lennox Grumbles for evaliation of  BP diabetes mellitus plantar fasciitis and pain medication for plantar fasciitis. Avoid nonsteroidal anti-inflammatory medications due to your diabetes  F/U surgical evaluation. May consider pending follow-up evaluations area we will avoid surgical evaluation at this time  F/U neurological evaluation. May consider PNCV/EMG studies pending follow-up evaluations  F/U with podiatrist Dr. Earleen Newport  for evaluation and treatment of plantar fasciitis as discussed  Consider wearing cervical collar when  sleeping and try to maintain proper positioning of the neck as discussed to avoid paresthesias of the upper extremity  May consider radiofrequency rhizolysis or intraspinal procedures pending response to present treatment and F/U evaluation  Patient is to call pain management prior to appointment for any concerns

## 2015-10-05 NOTE — Patient Instructions (Addendum)
PLAN   Continue present medication  Oxycodone Voltaren Gel and  Zanaflex.. You may apply Voltaren Gel to the area of the hand as discussed   F/U PCP Dr. Lennox Grumbles for evaliation of  BP diabetes mellitus plantar fasciitis and pain medication for plantar fasciitis. Avoid nonsteroidal anti-inflammatory medications due to your diabetes  F/U surgical evaluation. May consider pending follow-up evaluations area we will avoid surgical evaluation at this time  F/U neurological evaluation. May consider PNCV/EMG studies pending follow-up evaluations  F/U with podiatrist Dr. Earleen Newport for evaluation and treatment of plantar fasciitis as discussed  Consider wearing cervical collar when sleeping and try to maintain proper positioning of the neck as discussed to avoid paresthesias of the upper extremity  May consider radiofrequency rhizolysis or intraspinal procedures pending response to present treatment and F/U evaluation  Patient is to call pain management prior to appointment for any concerns

## 2015-10-05 NOTE — Progress Notes (Signed)
Safety precautions to be maintained throughout the outpatient stay will include: orient to surroundings, keep bed in low position, maintain call bell within reach at all times, provide assistance with transfer out of bed and ambulation.  

## 2015-10-24 ENCOUNTER — Ambulatory Visit: Payer: Medicare Other | Admitting: Podiatry

## 2015-10-26 ENCOUNTER — Ambulatory Visit (INDEPENDENT_AMBULATORY_CARE_PROVIDER_SITE_OTHER): Payer: Medicare Other | Admitting: Podiatry

## 2015-10-26 ENCOUNTER — Encounter: Payer: Self-pay | Admitting: Podiatry

## 2015-10-26 DIAGNOSIS — M722 Plantar fascial fibromatosis: Secondary | ICD-10-CM | POA: Diagnosis not present

## 2015-10-26 NOTE — Progress Notes (Signed)
Patient ID: Diana Bishop, female   DOB: [birth year], 60 y.o.   MRN: [id number]  Subjective: 60 year old female presents the office they for follow-up evaluation of left heel pain. She states that she is doing better how she still gets some pain to the left side. She is asking for the plantar fascial tapings at today because she states this helps quite a bit. She is going to family reunion this weekend and should have this done. She is also inquiring about possible steroid injection. She states that she is continuing stretching, icing exercises on somewhat regular basis but not consistently. She has changed her shoes as well. She has not tried inserts in her shoes yet. Denies any systemic complaints such as fevers, chills, nausea, vomiting. No acute changes since last appointment, and no other complaints at this time.   Objective: AAO x3, NAD DP/PT pulses palpable bilaterally, CRT less than 3 seconds There is mild tenderness to palpation along the plantar medial tubercle of the calcaneus at the insertion of plantar fascia on the left foot. There is no pain along the course of the plantar fascia within the arch of the foot. Plantar fascia appears to be intact. There is no pain with lateral compression of the calcaneus or pain with vibratory sensation. There is no pain along the course or insertion of the achilles tendon. No other areas of tenderness to bilateral lower extremities. No areas of pinpoint bony tenderness or pain with vibratory sensation. MMT 5/5, ROM WNL. No edema, erythema, increase in warmth to bilateral lower extremities.  No open lesions or pre-ulcerative lesions.  No pain with calf compression, swelling, warmth, erythema  Assessment: Left heel pain; plantar fasciitis   Plan: -All treatment options discussed with the patient including all alternatives, risks, complications.  -Patient elects to proceed with steroid injection into the left heel. Under sterile skin preparation, a  total of 2.5cc of kenalog 10, 0.5% Marcaine plain, and 2% lidocaine plain were infiltrated into the symptomatic area without complication. A band-aid was applied. Patient tolerated the injection well without complication. Post-injection care with discussed with the patient. Discussed with the patient to ice the area over the next couple of days to help prevent a steroid flare.  -Plantar fascial taping applied. -Continue stretching, icing exercises daily. -I discussed orthotics. She's been looking an over-the-counter pain or. She did change her shoes which is improved but I also believe that she still benefit from an insert given the plantar fascial tapings help quite a bit. -Follow-up in 4 weeks or sooner if any issues are to arise. Encouraged to call any questions, concerns or any change in symptoms.  Celesta Gentile, DPM

## 2015-11-02 ENCOUNTER — Encounter: Payer: Self-pay | Admitting: Pain Medicine

## 2015-11-02 ENCOUNTER — Ambulatory Visit: Payer: Medicare Other | Attending: Pain Medicine | Admitting: Pain Medicine

## 2015-11-02 VITALS — BP 149/79 | HR 88 | Temp 98.5°F | Resp 18 | Ht 64.0 in | Wt 247.0 lb

## 2015-11-02 DIAGNOSIS — M533 Sacrococcygeal disorders, not elsewhere classified: Secondary | ICD-10-CM

## 2015-11-02 DIAGNOSIS — M62830 Muscle spasm of back: Secondary | ICD-10-CM | POA: Insufficient documentation

## 2015-11-02 DIAGNOSIS — E1140 Type 2 diabetes mellitus with diabetic neuropathy, unspecified: Secondary | ICD-10-CM | POA: Insufficient documentation

## 2015-11-02 DIAGNOSIS — M542 Cervicalgia: Secondary | ICD-10-CM | POA: Diagnosis present

## 2015-11-02 DIAGNOSIS — M7060 Trochanteric bursitis, unspecified hip: Secondary | ICD-10-CM | POA: Insufficient documentation

## 2015-11-02 DIAGNOSIS — M5116 Intervertebral disc disorders with radiculopathy, lumbar region: Secondary | ICD-10-CM | POA: Insufficient documentation

## 2015-11-02 DIAGNOSIS — M4806 Spinal stenosis, lumbar region: Secondary | ICD-10-CM | POA: Diagnosis not present

## 2015-11-02 DIAGNOSIS — M47816 Spondylosis without myelopathy or radiculopathy, lumbar region: Secondary | ICD-10-CM

## 2015-11-02 DIAGNOSIS — M722 Plantar fascial fibromatosis: Secondary | ICD-10-CM

## 2015-11-02 DIAGNOSIS — M79602 Pain in left arm: Secondary | ICD-10-CM | POA: Diagnosis present

## 2015-11-02 DIAGNOSIS — M5126 Other intervertebral disc displacement, lumbar region: Secondary | ICD-10-CM | POA: Diagnosis not present

## 2015-11-02 DIAGNOSIS — M79601 Pain in right arm: Secondary | ICD-10-CM | POA: Diagnosis present

## 2015-11-02 DIAGNOSIS — M160 Bilateral primary osteoarthritis of hip: Secondary | ICD-10-CM

## 2015-11-02 DIAGNOSIS — M5136 Other intervertebral disc degeneration, lumbar region: Secondary | ICD-10-CM

## 2015-11-02 MED ORDER — DICLOFENAC EPOLAMINE 1.3 % TD PTCH: MEDICATED_PATCH | TRANSDERMAL | 0 refills | Status: DC

## 2015-11-02 MED ORDER — OXYCODONE HCL 10 MG PO TABS: ORAL_TABLET | ORAL | 0 refills | Status: DC

## 2015-11-02 MED ORDER — TIZANIDINE HCL 2 MG PO TABS: ORAL_TABLET | ORAL | 0 refills | Status: DC

## 2015-11-02 MED ORDER — DICLOFENAC SODIUM 1 % TD GEL: TRANSDERMAL | 0 refills | Status: DC

## 2015-11-02 NOTE — Patient Instructions (Addendum)
PLAN   Continue present medication  Oxycodone Voltaren Gel and  Zanaflex.. You may apply Voltaren Gel to the area of the hand as discussed   Hip injection/Greater trochanteric bursa injection to be performed at time of return appointment  F/U PCP Dr. Lennox Grumbles for evaliation of  BP diabetes mellitus plantar fasciitis and pain medication for plantar fasciitis. Avoid nonsteroidal anti-inflammatory medications due to your diabetes  F/U surgical evaluation. May consider pending follow-up evaluations We will avoid surgical evaluation at this time  F/U neurological evaluation. May consider PNCV/EMG studies pending follow-up evaluations  F/U with podiatrist Dr. Earleen Newport for evaluation and treatment of plantar fasciitis as discussed  Consider wearing cervical collar when sleeping and try to maintain proper positioning of the neck as discussed to avoid paresthesias of the upper extremity  May consider radiofrequency rhizolysis or intraspinal procedures pending response to present treatment and F/U evaluation  Patient is to call pain management prior to appointment for any concerns  Trigger Point Injection Trigger points are areas where you have muscle pain. A trigger point injection is a shot given in the trigger point to relieve that pain. A trigger point might feel like a knot in your muscle. It hurts to press on a trigger point. Sometimes the pain spreads out (radiates) to other parts of the body. For example, pressing on a trigger point in your shoulder might cause pain in your arm or neck. You might have one trigger point. Or, you might have more than one. People often have trigger points in their upper back and lower back. They also occur often in the neck and shoulders. Pain from a trigger point lasts for a long time. It can make it hard to keep moving. You might not be able to do the exercise or physical therapy that could help you deal with the pain. A trigger point injection may help. It does not  work for everyone. But, it may relieve your pain for a few days or a few months. A trigger point injection does not cure long-lasting (chronic) pain. LET YOUR CAREGIVER KNOW ABOUT:  Any allergies (especially to latex, lidocaine, or steroids).  Blood-thinning medicines that you take. These drugs can lead to bleeding or bruising after an injection. They include:  Aspirin.  Ibuprofen.  Clopidogrel.  Warfarin.  Other medicines you take. This includes all vitamins, herbs, eyedrops, over-the-counter medicines, and creams.  Use of steroids.  Recent infections.  Past problems with numbing medicines.  Bleeding problems.  Surgeries you have had.  Other health problems. RISKS AND COMPLICATIONS A trigger point injection is a safe treatment. However, problems may develop, such as:  Minor side effects usually go away in 1 to 2 days. These may include:  Soreness.  Bruising.  Stiffness.  More serious problems are rare. But, they may include:  Bleeding under the skin (hematoma).  Skin infection.  Breaking off of the needle under your skin.  Lung puncture.  The trigger point injection may not work for you. BEFORE THE PROCEDURE You may need to stop taking any medicine that thins your blood. This is to prevent bleeding and bruising. Usually these medicines are stopped several days before the injection. No other preparation is needed. PROCEDURE  A trigger point injection can be given in your caregiver's office or in a clinic. Each injection takes 2 minutes or less.  Your caregiver will feel for trigger points. The caregiver may use a marker to circle the area for the injection.  The skin over the  trigger point will be washed with a germ-killing (antiseptic) solution.  The caregiver pinches the spot for the injection.  Then, a very thin needle is used for the shot. You may feel pain or a twitching feeling when the needle enters the trigger point.  A numbing solution may be  injected into the trigger point. Sometimes a drug to keep down swelling, redness, and warmth (inflammation) is also injected.  Your caregiver moves the needle around the trigger zone until the tightness and twitching goes away.  After the injection, your caregiver may put gentle pressure over the injection site.  Then it is covered with a bandage. AFTER THE PROCEDURE  You can go right home after the injection.  The bandage can be taken off after a few hours.  You may feel sore and stiff for 1 to 2 days.  Go back to your regular activities slowly. Your caregiver may ask you to stretch your muscles. Do not do anything that takes extra energy for a few days.  Follow your caregiver's instructions to manage and treat other pain.   This information is not intended to replace advice given to you by your health care provider. Make sure you discuss any questions you have with your health care provider.   Document Released: 02/21/2011 Document Revised: 06/29/2012 Document Reviewed: 02/21/2011 Elsevier Interactive Patient Education 2016 [employer].  Oxycodone script given to patient.

## 2015-11-02 NOTE — Progress Notes (Signed)
The patient is a 60 year old female who returns to pain management for further evaluation and treatment of pain involving the neck upper extremity regions mid back lower back lower extremity region. The patient is with significant pain involving the lower back lower extremity region and has significant component of greater trochanteric bursitis. The patient also is with plantar fasciitis of the left foot. Present time patient continues medications consisting of Zanaflex oxycodone and we have also discussed the use of Voltaren Gel and Flector patch. The patient denies any trauma change in events of daily living the call significant change in symptoms pathology. The patient was able to attend family reunion without significant pain interfering with activities of daily living to significant degree. The patient states that the present time she has had some return of significant bursitis and wishes to undergo interventional treatment at time return appointment in attempt to decrease severity of symptoms. There is concern regarding severe greater trochanteric bursitis which is felt to be medically necessary procedure. All agreed to suggested treatment plan.     Physical examination   There was tenderness of the splenius capitis and occipitalis region a mild degree with mild tenderness over this cervical facet cervical paraspinal musculature region. Palpation of the acromial clavicular and glenohumeral joint regions reproduce mild to moderate discomfort and patient appeared to be with mild to moderate difficulty performing the drop test. Tinel and Phalen's maneuver were without increased pain of significant degree and patient appeared to be with bilaterally equal grip strength. Palpation over the thoracic region was evidence of moderate muscle spasms of the lower thoracic region with no crepitus of the thoracic region noted. Palpation over the lumbar paraspinal muscular region lumbar facet region was attends  to palpation with lateral bending rotation extension and palpation of the lumbar facets reproducing moderate discomfort on the left as well as on the right with straight leg raising tolerates approximately 20 without increased pain with dorsiflexion noted. EHL strength appeared to be slightly decreased without a sensory deficit of dermatomal dystrophy detected. Palpation of the greater trochanteric region iliotibial band region reproduced severe pain as well as moderate tends to palpation of the PSIS and PII S region. The knees were attends to palpation with no increased warmth and erythema of the knees. There was negative clonus negative Homans. 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 You may apply Voltaren Gel to the area of the hand as discussed   Hip injection/Greater trochanteric bursa injection to be performed at time of return appointment  F/U PCP Dr. Lennox Grumbles for evaliation of  BP diabetes mellitus plantar fasciitis and pain medication for plantar fasciitis. Avoid nonsteroidal anti-inflammatory medications due to your diabetes  F/U surgical evaluation. May consider pending follow-up evaluations We will avoid surgical evaluation at this time  F/U neurological evaluation. May consider PNCV/EMG studies pending follow-up evaluations  F/U with podiatrist Dr. Earleen Newport for evaluation and treatment of plantar  fasciitis as  discussed  Consider wearing cervical collar when sleeping and try to maintain proper positioning of the neck as discussed to avoid paresthesias of the upper extremity  May consider radiofrequency rhizolysis or intraspinal procedures pending response to present treatment and F/U evaluation  Patient is to call pain management prior to appointment for any concerns

## 2015-11-02 NOTE — Progress Notes (Signed)
Safety precautions to be maintained throughout the outpatient stay will include: orient to surroundings, keep bed in low position, maintain call bell within reach at all times, provide assistance with transfer out of bed and ambulation.  

## 2015-11-21 ENCOUNTER — Telehealth: Payer: Self-pay | Admitting: *Deleted

## 2015-11-30 ENCOUNTER — Ambulatory Visit: Payer: Medicare Other | Admitting: Podiatry

## 2015-12-03 ENCOUNTER — Other Ambulatory Visit: Payer: Self-pay | Admitting: Pain Medicine

## 2015-12-04 ENCOUNTER — Other Ambulatory Visit: Payer: Self-pay | Admitting: Pain Medicine

## 2015-12-04 ENCOUNTER — Ambulatory Visit: Payer: Medicare Other | Admitting: Pain Medicine

## 2015-12-07 ENCOUNTER — Other Ambulatory Visit: Payer: Self-pay | Admitting: Pain Medicine

## 2015-12-09 ENCOUNTER — Other Ambulatory Visit: Payer: Self-pay | Admitting: Pain Medicine

## 2016-04-01 ENCOUNTER — Other Ambulatory Visit: Payer: Self-pay | Admitting: Pain Medicine

## 2017-08-27 ENCOUNTER — Other Ambulatory Visit: Payer: Self-pay | Admitting: Family Medicine

## 2017-08-27 DIAGNOSIS — Z1231 Encounter for screening mammogram for malignant neoplasm of breast: Secondary | ICD-10-CM

## 2017-09-30 ENCOUNTER — Ambulatory Visit
Admission: RE | Admit: 2017-09-30 | Discharge: 2017-09-30 | Disposition: A | Discharge status: Home / Self Care | Payer: Medicare Other | Source: Ambulatory Visit | Attending: Family Medicine | Admitting: Family Medicine

## 2017-09-30 DIAGNOSIS — Z1231 Encounter for screening mammogram for malignant neoplasm of breast: Secondary | ICD-10-CM | POA: Diagnosis present

## 2017-10-07 ENCOUNTER — Encounter: Payer: Self-pay | Admitting: *Deleted

## 2017-10-07 ENCOUNTER — Other Ambulatory Visit: Payer: Self-pay | Admitting: Family Medicine

## 2017-10-07 DIAGNOSIS — R928 Other abnormal and inconclusive findings on diagnostic imaging of breast: Secondary | ICD-10-CM

## 2017-10-10 ENCOUNTER — Ambulatory Visit
Admission: RE | Admit: 2017-10-10 | Discharge: 2017-10-10 | Disposition: A | Discharge status: Home / Self Care | Payer: Medicare Other | Source: Ambulatory Visit | Attending: Family Medicine | Admitting: Family Medicine

## 2017-10-10 DIAGNOSIS — R928 Other abnormal and inconclusive findings on diagnostic imaging of breast: Secondary | ICD-10-CM

## 2017-10-15 ENCOUNTER — Telehealth: Payer: Self-pay | Admitting: Gastroenterology

## 2017-10-15 NOTE — Telephone Encounter (Signed)
Patients call has been returned to schedule her screening colonoscopy.  She has requested to have an upper endoscopy due to reflux, therefore an office visit has been scheduled to discuss.  Her appt is 11/25/17 at 1:15pm with Dr. Vicente Males.   Thanks [employer]

## 2017-10-15 NOTE — Telephone Encounter (Signed)
Patient LVM that she needs to schedule a colonoscopy and EGD. Please call

## 2017-11-25 ENCOUNTER — Ambulatory Visit: Payer: Medicare Other | Admitting: Gastroenterology

## 2018-01-06 ENCOUNTER — Ambulatory Visit: Payer: Medicare Other | Admitting: Gastroenterology

## 2018-01-06 ENCOUNTER — Encounter

## 2018-02-18 ENCOUNTER — Ambulatory Visit (INDEPENDENT_AMBULATORY_CARE_PROVIDER_SITE_OTHER): Payer: Medicare Other | Admitting: Gastroenterology

## 2018-02-18 ENCOUNTER — Other Ambulatory Visit: Payer: Self-pay

## 2018-02-18 ENCOUNTER — Encounter: Payer: Self-pay | Admitting: Gastroenterology

## 2018-02-18 VITALS — BP 145/86 | HR 99 | Ht 64.0 in | Wt 239.2 lb

## 2018-02-18 DIAGNOSIS — K219 Gastro-esophageal reflux disease without esophagitis: Secondary | ICD-10-CM | POA: Diagnosis not present

## 2018-02-18 DIAGNOSIS — Z85038 Personal history of other malignant neoplasm of large intestine: Secondary | ICD-10-CM | POA: Diagnosis not present

## 2018-02-18 MED ORDER — FAMOTIDINE 40 MG PO TABS: 40.0000 mg | ORAL_TABLET | Freq: Every day | ORAL | 1 refills | Status: DC

## 2018-02-18 NOTE — Progress Notes (Signed)
Diana Bellows MD, MRCP(U.K) [address]  [place]  [place], [place] [address]  Main: [phone]  Fax: [phone]   Gastroenterology Consultation  Referring Provider:     Marguerita Merles, MD Primary Care Physician:  Diana Merles, MD Primary Gastroenterologist:  Dr. Jonathon Bishop  Reason for Consultation:     GERD and screening colonoscopy         HPI:   Diana Bishop is a 62 y.o. y/o female referred for consultation & management  by Dr. Lennox Bishop, Diana Burkitt, MD.    She is here today to discuss about GERD and a screening colonoscopy .    Reflux:  Onset : atleast 10 years if not longer  Symptoms: heartburn, sometimes regurgitation. Sometimes like abdominal discomfort, 1-2 symptoms a week she has symptoms   Recent weight gain: lost some weight - unintentionally- lost 6 lbs over 3 months - Medications: Protonix 20 mg as needed  Narcotics or anticholinergics use : oxycodone  PPI /H2 blockers or Antacid  use and timing  As needed  Dinner time : 6-8 pm - bedtime at midnight   Prior EGD: n0  Family history of esophageal cancer:no   Last colonoscopy- > 6 years back - recalls she had polyps in the past . No family history of colon cancer, sister had polyps.    Past Medical History:  Diagnosis Date  . Anxiety   . Diabetes mellitus without complication ([place])   . Fibromyalgia   . Fibromyalgia muscle pain   . Hypertension   . Neuromuscular disorder ([place])   . Neuropathy due to type 2 diabetes mellitus ([place])   . Plantar fasciitis    5/17  . Thyroid disease     Past Surgical History:  Procedure Laterality Date  . ABDOMINAL HYSTERECTOMY    . CHOLECYSTECTOMY    . goiter surgery    . KNEE SURGERY Bilateral     Prior to Admission medications   Medication Sig Start Date End Date Taking? Authorizing Provider  amLODipine (NORVASC) 10 MG tablet Take 10 mg by mouth daily.    [provider]  canagliflozin (INVOKANA) 100 MG TABS tablet Take 100 mg by mouth daily.     [provider]  diclofenac (FLECTOR) 1.3 % PTCH Place 1 patch onto the skin 2 (two) times daily. 10/05/15   Mohammed Kindle, MD  diclofenac (FLECTOR) 1.3 % PTCH Apply 1 patch to affected area twice a day if tolerated 11/02/15   Mohammed Kindle, MD  diclofenac sodium (VOLTAREN) 1 % GEL Apply 2-4 g to the painful area 4 times per day if tolerated. A maximum of 4 mg only can be applied at one time 10/05/15   Mohammed Kindle, MD  diclofenac sodium (VOLTAREN) 1 % GEL Apply 2-4 g to the painful area 4 times per day if tolerated. A maximum of 4 mg only can be applied at one time 11/02/15   Mohammed Kindle, MD  DULoxetine (CYMBALTA) 30 MG capsule Take 30 mg by mouth daily.    [provider]  furosemide (LASIX) 20 MG tablet Take 20 mg by mouth daily.    [provider]  glipiZIDE (GLUCOTROL) 10 MG tablet Take 10 mg by mouth 2 (two) times daily before a meal. Reported on 06/08/2015    [provider]  linagliptin (TRADJENTA) 5 MG TABS tablet Take 5 mg by mouth daily. Reported on 10/05/2015    [provider]  lisinopril (PRINIVIL,ZESTRIL) 40 MG tablet Take 40 mg by  mouth daily.    [provider]  meloxicam (MOBIC) 7.5 MG tablet Take 1 tablet (7.5 mg total) by mouth daily. 09/26/15   Trula Slade, DPM  omeprazole (PRILOSEC) 20 MG capsule Take 20 mg by mouth daily.    [provider]  Oxycodone HCl 10 MG TABS Limit 1 tab by mouth 2-3 times per day if tolerated 11/02/15   Mohammed Kindle, MD  pregabalin (LYRICA) 150 MG capsule Take 150 mg by mouth 2 (two) times daily.    [provider]  tizanidine (ZANAFLEX) 2 MG capsule Limit 1 tab by mouth per day or twice per day if tolerated.  Please provide patient with TABLET FORM form of Zanaflex ( tizanidine ) 10/05/15   Mohammed Kindle, MD  tiZANidine (ZANAFLEX) 2 MG tablet Limit 1 tablet by mouth per day or twice per day if tolerated 11/02/15   Mohammed Kindle, MD    Family History  Problem Relation Age  of Onset  . GER disease Mother   . Arthritis Mother   . Diabetes Father   . Breast cancer Neg Hx      Social History   Tobacco Use  . Smoking status: Never Smoker  . Smokeless tobacco: Never Used  Substance Use Topics  . Alcohol use: No    Alcohol/week: 0.0 standard drinks  . Drug use: No    Allergies as of 02/18/2018  . (No Known Allergies)    Review of Systems:    All systems reviewed and negative except where noted in HPI.   Physical Exam:  There were no vitals taken for this visit. No LMP recorded. Patient has had a hysterectomy. Psych:  Alert and cooperative. Normal mood and affect. General:   Alert,  Well-developed, well-nourished, pleasant and cooperative in NAD Head:  Normocephalic and atraumatic. Eyes:  Sclera clear, no icterus.   Conjunctiva pink. Ears:  Normal auditory acuity. Nose:  No deformity, discharge, or lesions. Mouth:  No deformity or lesions,oropharynx pink & moist. Neck:  Supple; no masses or thyromegaly. Lungs:  Respirations even and unlabored.  Clear throughout to auscultation.   No wheezes, crackles, or rhonchi. No acute distress. Heart:  Regular rate and rhythm; no murmurs, clicks, rubs, or gallops. Abdomen:  Normal bowel sounds.  No bruits.  Soft, non-tender and non-distended without masses, hepatosplenomegaly or hernias noted.  No guarding or rebound tenderness.    Neurologic:  Alert and oriented x3;  grossly normal neurologically. Skin:  Intact without significant lesions or rashes. No jaundice. Lymph Nodes:  No significant cervical adenopathy. Psych:  Alert and cooperative. Normal mood and affect.  Imaging Studies: No results found.  Assessment and Plan:   Diana Bishop is a 62 y.o. y/o female has been referred for  a screening colonoscopy and discuss about GERD.Since symptoms are occasional suggst change from PPI to pepcid.   Plan  1. GERD : Counseled on life style changes,  Advised on the use of a wedge pillow at night , avoid  meals for 2 hours prior to bed time. Weight loss .Discussed the risks and benefits of long term PPI use including but not limited to bone loss, chronic kidney disease, infections , low magnesium . Changed to Pepcid.   2. EGD to screen for barrettes esophagus and colonoscopy    I have discussed alternative options, risks & benefits,  which include, but are not limited to, bleeding, infection, perforation,respiratory complication & drug reaction.  The patient agrees with this plan & written consent will  be obtained.    Follow up in 3 months   Dr Diana Bellows MD,MRCP(U.K)

## 2018-02-18 NOTE — Patient Instructions (Signed)

## 2018-02-26 ENCOUNTER — Encounter: Payer: Self-pay | Admitting: *Deleted

## 2018-02-26 ENCOUNTER — Encounter
Admission: RE | Disposition: A | Discharge status: Home / Self Care | Payer: Self-pay | Source: Home / Self Care | Attending: Gastroenterology

## 2018-02-26 ENCOUNTER — Ambulatory Visit: Payer: Medicare Other | Admitting: Anesthesiology

## 2018-02-26 ENCOUNTER — Ambulatory Visit
Admission: RE | Admit: 2018-02-26 | Discharge: 2018-02-26 | Disposition: A | Discharge status: Home / Self Care | Payer: Medicare Other | Attending: Gastroenterology | Admitting: Gastroenterology

## 2018-02-26 DIAGNOSIS — Z85038 Personal history of other malignant neoplasm of large intestine: Secondary | ICD-10-CM

## 2018-02-26 DIAGNOSIS — K219 Gastro-esophageal reflux disease without esophagitis: Secondary | ICD-10-CM

## 2018-02-26 DIAGNOSIS — D123 Benign neoplasm of transverse colon: Secondary | ICD-10-CM | POA: Diagnosis not present

## 2018-02-26 DIAGNOSIS — M797 Fibromyalgia: Secondary | ICD-10-CM | POA: Insufficient documentation

## 2018-02-26 DIAGNOSIS — Z79899 Other long term (current) drug therapy: Secondary | ICD-10-CM | POA: Insufficient documentation

## 2018-02-26 DIAGNOSIS — E1140 Type 2 diabetes mellitus with diabetic neuropathy, unspecified: Secondary | ICD-10-CM | POA: Diagnosis not present

## 2018-02-26 DIAGNOSIS — I10 Essential (primary) hypertension: Secondary | ICD-10-CM | POA: Diagnosis not present

## 2018-02-26 DIAGNOSIS — Z13810 Encounter for screening for upper gastrointestinal disorder: Secondary | ICD-10-CM | POA: Diagnosis not present

## 2018-02-26 DIAGNOSIS — Z7984 Long term (current) use of oral hypoglycemic drugs: Secondary | ICD-10-CM | POA: Diagnosis not present

## 2018-02-26 DIAGNOSIS — F419 Anxiety disorder, unspecified: Secondary | ICD-10-CM | POA: Insufficient documentation

## 2018-02-26 DIAGNOSIS — Z86010 Personal history of colonic polyps: Secondary | ICD-10-CM

## 2018-02-26 DIAGNOSIS — Z09 Encounter for follow-up examination after completed treatment for conditions other than malignant neoplasm: Secondary | ICD-10-CM | POA: Diagnosis not present

## 2018-02-26 DIAGNOSIS — D122 Benign neoplasm of ascending colon: Secondary | ICD-10-CM

## 2018-02-26 DIAGNOSIS — K635 Polyp of colon: Secondary | ICD-10-CM | POA: Diagnosis not present

## 2018-02-26 DIAGNOSIS — D125 Benign neoplasm of sigmoid colon: Secondary | ICD-10-CM

## 2018-02-26 HISTORY — PX: ESOPHAGOGASTRODUODENOSCOPY (EGD) WITH PROPOFOL: SHX5813

## 2018-02-26 HISTORY — DX: Other intervertebral disc degeneration, lumbosacral region without mention of lumbar back pain or lower extremity pain: M51.379

## 2018-02-26 HISTORY — DX: Other intervertebral disc degeneration, lumbosacral region: M51.37

## 2018-02-26 HISTORY — DX: Dorsalgia, unspecified: M54.9

## 2018-02-26 HISTORY — DX: Other intervertebral disc degeneration, lumbar region: M51.36

## 2018-02-26 HISTORY — PX: COLONOSCOPY WITH PROPOFOL: SHX5780

## 2018-02-26 LAB — GLUCOSE, CAPILLARY: Glucose-Capillary: 156 mg/dL — ABNORMAL HIGH (ref 70–99)

## 2018-02-26 SURGERY — COLONOSCOPY WITH PROPOFOL
Anesthesia: General

## 2018-02-26 MED ORDER — PROPOFOL 10 MG/ML IV BOLUS
INTRAVENOUS | Status: DC | PRN
  Administered 2018-02-26: 80 mg via INTRAVENOUS

## 2018-02-26 MED ORDER — FENTANYL CITRATE (PF) 100 MCG/2ML IJ SOLN
INTRAMUSCULAR | Status: AC
  Filled 2018-02-26: qty 2

## 2018-02-26 MED ORDER — FENTANYL CITRATE (PF) 100 MCG/2ML IJ SOLN
INTRAMUSCULAR | Status: DC | PRN
  Administered 2018-02-26: 25 ug via INTRAVENOUS
  Administered 2018-02-26: 50 ug via INTRAVENOUS
  Administered 2018-02-26: 25 ug via INTRAVENOUS

## 2018-02-26 MED ORDER — PROPOFOL 500 MG/50ML IV EMUL
INTRAVENOUS | Status: DC | PRN
  Administered 2018-02-26: 100 ug/kg/min via INTRAVENOUS

## 2018-02-26 MED ORDER — PHENYLEPHRINE HCL 10 MG/ML IJ SOLN
INTRAMUSCULAR | Status: DC | PRN
  Administered 2018-02-26: 100 ug via INTRAVENOUS

## 2018-02-26 MED ORDER — MIDAZOLAM HCL 2 MG/2ML IJ SOLN
INTRAMUSCULAR | Status: DC | PRN
  Administered 2018-02-26: 2 mg via INTRAVENOUS

## 2018-02-26 MED ORDER — PROPOFOL 500 MG/50ML IV EMUL
INTRAVENOUS | Status: AC
  Filled 2018-02-26: qty 50

## 2018-02-26 MED ORDER — MIDAZOLAM HCL 2 MG/2ML IJ SOLN
INTRAMUSCULAR | Status: AC
  Filled 2018-02-26: qty 2

## 2018-02-26 MED ORDER — SODIUM CHLORIDE 0.9 % IV SOLN
INTRAVENOUS | Status: DC
  Administered 2018-02-26: 08:00:00 via INTRAVENOUS

## 2018-02-26 NOTE — H&P (Signed)
Jonathon Bellows, MD [address] [place], [place], [place], [address] [address] [place], [place], [place], [address] Phone: [phone]  Fax: [phone]  Primary Care Physician:  Marguerita Merles, MD   Pre-Procedure History & Physical: HPI:  Diana Bishop is a 62 y.o. female is here for an endoscopy and colonoscopy    Past Medical History:  Diagnosis Date  . Anxiety   . Back pain   . Degenerative disc disease at L5-S1 level   . Diabetes mellitus without complication ([facility])   . Fibromyalgia   . Fibromyalgia muscle pain   . Hypertension   . Neuromuscular disorder ([facility])   . Neuropathy due to type 2 diabetes mellitus ([place])   . Plantar fasciitis    5/17  . Thyroid disease     Past Surgical History:  Procedure Laterality Date  . ABDOMINAL HYSTERECTOMY    . CHOLECYSTECTOMY    . goiter surgery    . KNEE SURGERY Bilateral     Prior to Admission medications   Medication Sig Start Date End Date Taking? Authorizing Provider  amLODipine (NORVASC) 10 MG tablet Take 10 mg by mouth daily.   Yes [provider]  carvedilol (COREG) 12.5 MG tablet TAKE 1 TABLET BY MOUTH TWICE DAILY FOR BLOOD PRESSURE 01/06/18  Yes [provider]  diclofenac (FLECTOR) 1.3 % PTCH Apply 1 patch to affected area twice a day if tolerated 11/02/15  Yes Mohammed Kindle, MD  DULoxetine (CYMBALTA) 30 MG capsule Take 30 mg by mouth daily.   Yes [provider]  famotidine (PEPCID) 40 MG tablet Take 1 tablet (40 mg total) by mouth daily. 02/18/18 05/20/18 Yes Jonathon Bellows, MD  furosemide (LASIX) 20 MG tablet Take 20 mg by mouth daily.   Yes [provider]  JANUMET 50-1000 MG tablet TAKE 1 TABLET BY MOUTH TWICE DAILY FOR DIABETES 11/25/17  Yes [provider]  canagliflozin (INVOKANA) 100 MG TABS tablet Take 100 mg by mouth daily.    [provider]  diclofenac (FLECTOR) 1.3 % PTCH Place 1 patch onto the skin 2 (two) times daily. Patient not taking:  Reported on 02/18/2018 10/05/15   Mohammed Kindle, MD  diclofenac sodium (VOLTAREN) 1 % GEL Apply 2-4 g to the painful area 4 times per day if tolerated. A maximum of 4 mg only can be applied at one time 10/05/15   Mohammed Kindle, MD  diclofenac sodium (VOLTAREN) 1 % GEL Apply 2-4 g to the painful area 4 times per day if tolerated. A maximum of 4 mg only can be applied at one time 11/02/15   Mohammed Kindle, MD  glipiZIDE (GLUCOTROL) 10 MG tablet Take 10 mg by mouth 2 (two) times daily before a meal. Reported on 06/08/2015    [provider]  JARDIANCE 10 MG TABS tablet TAKE 1 TABLET BY MOUTH ONCE DAILY FOR DIABETES 12/05/17   [provider]  lidocaine (LIDODERM) 5 % APPLY 1 - 3 PATCHES TO SKIN FOR TWELVE HOURS ON AND TWELVE HOURS OFF. 01/23/18   [provider]  linagliptin (TRADJENTA) 5 MG TABS tablet Take 5 mg by mouth daily. Reported on 10/05/2015    [provider]  lisinopril (PRINIVIL,ZESTRIL) 40 MG tablet Take 40 mg by mouth daily.    [provider]  meloxicam (MOBIC) 7.5 MG tablet Take 1 tablet (7.5 mg total) by mouth daily. Patient not taking: Reported on 02/18/2018 09/26/15   Trula Slade, DPM  Oxycodone HCl 10  MG TABS Limit 1 tab by mouth 2-3 times per day if tolerated 11/02/15   Mohammed Kindle, MD  pregabalin (LYRICA) 150 MG capsule Take 150 mg by mouth 2 (two) times daily.    [provider]  tizanidine (ZANAFLEX) 2 MG capsule Limit 1 tab by mouth per day or twice per day if tolerated.  Please provide patient with TABLET FORM form of Zanaflex ( tizanidine ) 10/05/15   Mohammed Kindle, MD  tiZANidine (ZANAFLEX) 2 MG tablet Limit 1 tablet by mouth per day or twice per day if tolerated 11/02/15   Mohammed Kindle, MD    Allergies as of 02/18/2018  . (No Known Allergies)    Family History  Problem Relation Age of Onset  . GER disease Mother   . Arthritis Mother   . Diabetes Father   . Breast cancer Neg Hx     Social History    Socioeconomic History  . Marital status: Married    Spouse name: Not on file  . Number of children: Not on file  . Years of education: Not on file  . Highest education level: Not on file  Occupational History  . Not on file  Social Needs  . Financial resource strain: Not on file  . Food insecurity:    Worry: Not on file    Inability: Not on file  . Transportation needs:    Medical: Not on file    Non-medical: Not on file  Tobacco Use  . Smoking status: Never Smoker  . Smokeless tobacco: Never Used  Substance and Sexual Activity  . Alcohol use: No    Alcohol/week: 0.0 standard drinks  . Drug use: No  . Sexual activity: Not on file  Lifestyle  . Physical activity:    Days per week: Not on file    Minutes per session: Not on file  . Stress: Not on file  Relationships  . Social connections:    Talks on phone: Not on file    Gets together: Not on file    Attends religious service: Not on file    Active member of club or organization: Not on file    Attends meetings of clubs or organizations: Not on file    Relationship status: Not on file  . Intimate partner violence:    Fear of current or ex partner: Not on file    Emotionally abused: Not on file    Physically abused: Not on file    Forced sexual activity: Not on file  Other Topics Concern  . Not on file  Social History Narrative  . Not on file    Review of Systems: See HPI, otherwise negative ROS  Physical Exam: There were no vitals taken for this visit. General:   Alert,  pleasant and cooperative in NAD Head:  Normocephalic and atraumatic. Neck:  Supple; no masses or thyromegaly. Lungs:  Clear throughout to auscultation, normal respiratory effort.    Heart:  +S1, +S2, Regular rate and rhythm, No edema. Abdomen:  Soft, nontender and nondistended. Normal bowel sounds, without guarding, and without rebound.   Neurologic:  Alert and  oriented x4;  grossly normal neurologically.  Impression/Plan: Diana Bishop is here for an endoscopy and colonoscopy  to be performed for  evaluation of GERd and surveillance due to prior history of colon polyps    Risks, benefits, limitations, and alternatives regarding endoscopy have been reviewed with the patient.  Questions have been answered.  All parties agreeable.  Jonathon Bellows, MD  02/26/2018, 7:42 AM

## 2018-02-26 NOTE — Anesthesia Preprocedure Evaluation (Signed)
Anesthesia Evaluation  Patient identified by MRN, date of birth, ID band Patient awake    Reviewed: Allergy & Precautions, NPO status , Patient's Chart, lab work & pertinent test results  History of Anesthesia Complications Negative for: history of anesthetic complications  Airway Mallampati: II  TM Distance: >3 FB Neck ROM: Full    Dental no notable dental hx.    Pulmonary neg pulmonary ROS, neg sleep apnea, neg COPD,    breath sounds clear to auscultation- rhonchi (-) wheezing      Cardiovascular Exercise Tolerance: Good hypertension, Pt. on medications (-) CAD, (-) Past MI, (-) Cardiac Stents and (-) CABG  Rhythm:Regular Rate:Normal - Systolic murmurs and - Diastolic murmurs    Neuro/Psych neg Seizures Anxiety negative neurological ROS     GI/Hepatic negative GI ROS, Neg liver ROS,   Endo/Other  diabetes, Oral Hypoglycemic Agents  Renal/GU negative Renal ROS     Musculoskeletal  (+) Arthritis , Fibromyalgia -  Abdominal (+) + obese,   Peds  Hematology negative hematology ROS (+)   Anesthesia Other Findings Past Medical History: No date: Anxiety No date: Back pain No date: Degenerative disc disease at L5-S1 level No date: Diabetes mellitus without complication ([facility]) No date: Fibromyalgia No date: Fibromyalgia muscle pain No date: Hypertension No date: Neuromuscular disorder ([facility]) No date: Neuropathy due to type 2 diabetes mellitus ([facility]) No date: Plantar fasciitis     Comment:  5/17 No date: Thyroid disease   Reproductive/Obstetrics                             Anesthesia Physical Anesthesia Plan  ASA: II  Anesthesia Plan: General   Post-op Pain Management:    Induction: Intravenous  PONV Risk Score and Plan: 2 and Propofol infusion  Airway Management Planned: Natural Airway  Additional Equipment:   Intra-op Plan:   Post-operative Plan:   Informed Consent: I have  reviewed the patients History and Physical, chart, labs and discussed the procedure including the risks, benefits and alternatives for the proposed anesthesia with the patient or authorized representative who has indicated his/her understanding and acceptance.   Dental advisory given  Plan Discussed with: CRNA and Anesthesiologist  Anesthesia Plan Comments:         Anesthesia Quick Evaluation

## 2018-02-26 NOTE — Op Note (Signed)
[facility] Gastroenterology Patient Name: Diana Bishop Procedure Date: 02/26/2018 7:18 AM MRN: [id number] Account #: [id number] Date of Birth: 03-17-1956 Admit Type: Outpatient Age: 62 Room: [facility] ENDO ROOM 4 Gender: Female Note Status: Finalized Procedure:            Colonoscopy Indications:          High risk colon cancer surveillance: Personal history                        of colonic polyps Providers:            Jonathon Bellows MD, MD Referring MD:         Marguerita Merles, MD (Referring MD) Medicines:            Monitored Anesthesia Care Complications:        No immediate complications. Procedure:            Pre-Anesthesia Assessment:                       - Prior to the procedure, a History and Physical was                        performed, and patient medications, allergies and                        sensitivities were reviewed. The patient's tolerance of                        previous anesthesia was reviewed.                       - The risks and benefits of the procedure and the                        sedation options and risks were discussed with the                        patient. All questions were answered and informed                        consent was obtained.                       - ASA Grade Assessment: II - A patient with mild                        systemic disease.                       After obtaining informed consent, the colonoscope was                        passed under direct vision. Throughout the procedure,                        the patient's blood pressure, pulse, and oxygen                        saturations were monitored continuously. The  Colonoscope was introduced through the anus and                        advanced to the the cecum, identified by the                        appendiceal orifice, IC valve and transillumination.                        The colonoscopy was performed without difficulty. The                patient tolerated the procedure well. The quality of                        the bowel preparation was good. Findings:      The perianal and digital rectal examinations were normal.      Two sessile polyps were found in the sigmoid colon and ascending colon.       The polyps were 3 to 4 mm in size. These polyps were removed with a cold       biopsy forceps. Resection and retrieval were complete.      A 6 mm polyp was found in the transverse colon. The polyp was sessile.       The polyp was removed with a cold snare. Resection and retrieval were       complete.      The exam was otherwise without abnormality on direct and retroflexion       views. Impression:           - Two 3 to 4 mm polyps in the sigmoid colon and in the                        ascending colon, removed with a cold biopsy forceps.                        Resected and retrieved.                       - One 6 mm polyp in the transverse colon, removed with                        a cold snare. Resected and retrieved.                       - The examination was otherwise normal on direct and                        retroflexion views. Recommendation:       - Discharge patient to home (with escort).                       - Resume previous diet.                       - Continue present medications.                       - Await pathology results.                       - Repeat colonoscopy in 3 -  5 years for surveillance                        based on pathology results. Procedure Code(s):    --- Professional ---                       [id number], Colonoscopy, flexible; with removal of tumor(s),                        polyp(s), or other lesion(s) by snare technique                       45380, 38, Colonoscopy, flexible; with biopsy, single                        or multiple Diagnosis Code(s):    --- Professional ---                       Z86.010, Personal history of colonic polyps                       D12.5, Benign  neoplasm of sigmoid colon                       D12.2, Benign neoplasm of ascending colon                       D12.3, Benign neoplasm of transverse colon (hepatic                        flexure or splenic flexure) CPT copyright 2018 American Medical Association. All rights reserved. The codes documented in this report are preliminary and upon coder review may  be revised to meet current compliance requirements. Jonathon Bellows, MD Jonathon Bellows MD, MD 02/26/2018 8:15:52 AM This report has been signed electronically. Number of Addenda: 0 Note Initiated On: 02/26/2018 7:18 AM Scope Withdrawal Time: 0 hours 14 minutes 59 seconds  Total Procedure Duration: 0 hours 18 minutes 29 seconds       [facility]

## 2018-02-26 NOTE — Anesthesia Postprocedure Evaluation (Signed)
Anesthesia Post Note  Patient: Diana Bishop  Procedure(s) Performed: COLONOSCOPY WITH PROPOFOL (N/A ) ESOPHAGOGASTRODUODENOSCOPY (EGD) WITH PROPOFOL (N/A )  Patient location during evaluation: Endoscopy Anesthesia Type: General Level of consciousness: awake and alert and oriented Pain management: pain level controlled Vital Signs Assessment: post-procedure vital signs reviewed and stable Respiratory status: spontaneous breathing, nonlabored ventilation and respiratory function stable Cardiovascular status: blood pressure returned to baseline and stable Postop Assessment: no signs of nausea or vomiting Anesthetic complications: no     Last Vitals:  Vitals:   02/26/18 0822 02/26/18 0849  BP: (!) 97/58 129/84  Pulse:    Resp:    Temp:    SpO2:      Last Pain:  Vitals:   02/26/18 0849  TempSrc:   PainSc: 0-No pain                 Lasheika Ortloff

## 2018-02-26 NOTE — Op Note (Signed)
[facility] Gastroenterology Patient Name: Diana Bishop Procedure Date: 02/26/2018 7:19 AM MRN: [id number] Account #: [id number] Date of Birth: Nov 17, 1955 Admit Type: Outpatient Age: 62 Room: [facility] ENDO ROOM 4 Gender: Female Note Status: Finalized Procedure:            Upper GI endoscopy Indications:          Screening for Barrett's esophagus Providers:            Jonathon Bellows MD, MD Referring MD:         Marguerita Merles, MD (Referring MD) Medicines:            Monitored Anesthesia Care Complications:        No immediate complications. Procedure:            Pre-Anesthesia Assessment:                       - Prior to the procedure, a History and Physical was                        performed, and patient medications, allergies and                        sensitivities were reviewed. The patient's tolerance of                        previous anesthesia was reviewed.                       - The risks and benefits of the procedure and the                        sedation options and risks were discussed with the                        patient. All questions were answered and informed                        consent was obtained.                       - ASA Grade Assessment: II - A patient with mild                        systemic disease.                       After obtaining informed consent, the endoscope was                        passed under direct vision. Throughout the procedure,                        the patient's blood pressure, pulse, and oxygen                        saturations were monitored continuously. The Endoscope                        was introduced through the mouth, and advanced to the  third part of duodenum. The upper GI endoscopy was                        accomplished with ease. The patient tolerated the                        procedure well. Findings:      The esophagus was normal.      The stomach was normal.      The  examined duodenum was normal. Impression:           - Normal esophagus.                       - Normal stomach.                       - Normal examined duodenum.                       - No specimens collected. Recommendation:       - Perform a colonoscopy today. Procedure Code(s):    --- Professional ---                       [phone], Esophagogastroduodenoscopy, flexible, transoral;                        diagnostic, including collection of specimen(s) by                        brushing or washing, when performed (separate procedure) Diagnosis Code(s):    --- Professional ---                       Z13.810, Encounter for screening for upper                        gastrointestinal disorder CPT copyright 2018 American Medical Association. All rights reserved. The codes documented in this report are preliminary and upon coder review may  be revised to meet current compliance requirements. Jonathon Bellows, MD Jonathon Bellows MD, MD 02/26/2018 7:52:56 AM This report has been signed electronically. Number of Addenda: 0 Note Initiated On: 02/26/2018 7:19 AM      [facility]

## 2018-02-26 NOTE — Transfer of Care (Signed)
Immediate Anesthesia Transfer of Care Note  Patient: Diana Bishop  Procedure(s) Performed: COLONOSCOPY WITH PROPOFOL (N/A ) ESOPHAGOGASTRODUODENOSCOPY (EGD) WITH PROPOFOL (N/A )  Patient Location: PACU  Anesthesia Type:General  Level of Consciousness: sedated  Airway & Oxygen Therapy: Patient Spontanous Breathing and Patient connected to nasal cannula oxygen  Post-op Assessment: Report given to RN, Post -op Vital signs reviewed and stable and Patient moving all extremities  Post vital signs: Reviewed and stable  Last Vitals:  Vitals Value Taken Time  BP    Temp 36.1 C 02/26/2018  8:19 AM  Pulse    Resp    SpO2      Last Pain:  Vitals:   02/26/18 0819  TempSrc: Tympanic         Complications: No apparent anesthesia complications

## 2018-02-26 NOTE — Anesthesia Post-op Follow-up Note (Signed)
Anesthesia QCDR form completed.        

## 2018-03-02 LAB — SURGICAL PATHOLOGY

## 2018-03-08 ENCOUNTER — Encounter: Payer: Self-pay | Admitting: Gastroenterology

## 2018-05-20 ENCOUNTER — Ambulatory Visit (INDEPENDENT_AMBULATORY_CARE_PROVIDER_SITE_OTHER): Payer: Medicare Other | Admitting: Gastroenterology

## 2018-05-20 ENCOUNTER — Encounter: Payer: Self-pay | Admitting: Gastroenterology

## 2018-05-20 VITALS — BP 152/90 | HR 75 | Ht 64.0 in | Wt 241.0 lb

## 2018-05-20 DIAGNOSIS — K219 Gastro-esophageal reflux disease without esophagitis: Secondary | ICD-10-CM

## 2018-05-20 MED ORDER — FAMOTIDINE 40 MG PO TABS: 40.0000 mg | ORAL_TABLET | Freq: Two times a day (BID) | ORAL | 2 refills | Status: DC

## 2018-05-20 NOTE — Progress Notes (Signed)
Jonathon Bellows MD, MRCP(U.K) [address]  [place]  [place], [place] [address]  Main: [phone]  Fax: [phone]   Primary Care Physician: Marguerita Merles, MD  Primary Gastroenterologist:  Dr. Jonathon Bellows   Chief Complaint  Patient presents with  . Follow-up    GERD    HPI: Diana Bishop is a 63 y.o. female   Summary of history : She was initially referred and seen in December 2019 for colon cancer screening and acid reflux.  She has had reflux for at least 10 years described as heartburn and some regurgitation with abdominal discomfort, 1-2 times a week.  Unintentionally had lost 6 pounds over 3 months.  Had been on Protonix 20 mg as needed.  Had been on oxycodone.  She did recall that she had had colon polyps in the past.   Interval history   02/18/2018- 05/20/2018  02/26/2018 colonoscopy: 3 polyps resected one was a tubular adenoma and 2 other were hyperplastic., EGD showed no abnormalities at the GE junction and the rest of the examination was normal.  Gaind 2 lbs since last visit .She is on Famotidine once a day but still had some residual symptoms would like to go up in dose.   Current Outpatient Medications  Medication Sig Dispense Refill  . amLODipine (NORVASC) 10 MG tablet Take 10 mg by mouth daily.    . canagliflozin (INVOKANA) 100 MG TABS tablet Take 100 mg by mouth daily.    . carvedilol (COREG) 12.5 MG tablet TAKE 1 TABLET BY MOUTH TWICE DAILY FOR BLOOD PRESSURE  3  . diclofenac (FLECTOR) 1.3 % PTCH Place 1 patch onto the skin 2 (two) times daily. (Patient not taking: Reported on 02/18/2018) 60 patch 0  . diclofenac (FLECTOR) 1.3 % PTCH Apply 1 patch to affected area twice a day if tolerated 60 patch 0  . diclofenac sodium (VOLTAREN) 1 % GEL Apply 2-4 g to the painful area 4 times per day if tolerated. A maximum of 4 mg only can be applied at one time 500 g 0  . diclofenac sodium (VOLTAREN) 1 % GEL Apply 2-4 g to the painful area 4 times per day if  tolerated. A maximum of 4 mg only can be applied at one time 500 g 0  . DULoxetine (CYMBALTA) 30 MG capsule Take 30 mg by mouth daily.    . famotidine (PEPCID) 40 MG tablet Take 1 tablet (40 mg total) by mouth daily. 90 tablet 1  . furosemide (LASIX) 20 MG tablet Take 20 mg by mouth daily.    Marland Kitchen glipiZIDE (GLUCOTROL) 10 MG tablet Take 10 mg by mouth 2 (two) times daily before a meal. Reported on 06/08/2015    . JANUMET 50-1000 MG tablet TAKE 1 TABLET BY MOUTH TWICE DAILY FOR DIABETES  0  . JARDIANCE 10 MG TABS tablet TAKE 1 TABLET BY MOUTH ONCE DAILY FOR DIABETES  2  . lidocaine (LIDODERM) 5 % APPLY 1 - 3 PATCHES TO SKIN FOR TWELVE HOURS ON AND TWELVE HOURS OFF.  2  . linagliptin (TRADJENTA) 5 MG TABS tablet Take 5 mg by mouth daily. Reported on 10/05/2015    . lisinopril (PRINIVIL,ZESTRIL) 40 MG tablet Take 40 mg by mouth daily.    . meloxicam (MOBIC) 7.5 MG tablet Take 1 tablet (7.5 mg total) by mouth daily. (Patient not taking: Reported on 02/18/2018) 30 tablet 0  . Oxycodone HCl 10 MG TABS Limit 1 tab by mouth 2-3 times per day if  tolerated 90 tablet 0  . pregabalin (LYRICA) 150 MG capsule Take 150 mg by mouth 2 (two) times daily.    . tizanidine (ZANAFLEX) 2 MG capsule Limit 1 tab by mouth per day or twice per day if tolerated.  Please provide patient with TABLET FORM form of Zanaflex ( tizanidine ) 60 capsule 0  . tiZANidine (ZANAFLEX) 2 MG tablet Limit 1 tablet by mouth per day or twice per day if tolerated 60 tablet 0   No current facility-administered medications for this visit.     Allergies as of 05/20/2018  . (No Known Allergies)    ROS:  General: Negative for anorexia, weight loss, fever, chills, fatigue, weakness. ENT: Negative for hoarseness, difficulty swallowing , nasal congestion. CV: Negative for chest pain, angina, palpitations, dyspnea on exertion, peripheral edema.  Respiratory: Negative for dyspnea at rest, dyspnea on exertion, cough, sputum, wheezing.  GI: See history  of present illness. GU:  Negative for dysuria, hematuria, urinary incontinence, urinary frequency, nocturnal urination.  Endo: Negative for unusual weight change.    Physical Examination:   BP (!) 152/90   Pulse 75   Ht 5\' 4"  (1.626 m)   Wt 241 lb (109.3 kg)   BMI 41.37 kg/m   General: Well-nourished, well-developed in no acute distress.  Eyes: No icterus. Conjunctivae pink. Mouth: Oropharyngeal mucosa moist and pink , no lesions erythema or exudate. Lungs: Clear to auscultation bilaterally. Non-labored. Heart: Regular rate and rhythm, no murmurs rubs or gallops.  Abdomen: Bowel sounds are normal, nontender, nondistended, no hepatosplenomegaly or masses, no abdominal bruits or hernia , no rebound or guarding.   Extremities: No lower extremity edema. No clubbing or deformities. Neuro: Alert and oriented x 3.  Grossly intact. Skin: Warm and dry, no jaundice.   Psych: Alert and cooperative, normal mood and affect.   Imaging Studies: No results found.  Assessment and Plan:   Diana Bishop is a 63 y.o. y/o female here to follow-up for acid reflux.  No evidence of Barrett's esophagus or esophagitis on recent EGD.  Plan  1. Continue lifestyle changes  2. No food for 2 hours before bed time, she will try a wedge pillow at night  3. At next visit if doing well will plan to decrease dose of Famotidine .    Dr Jonathon Bellows  MD,MRCP [facility]) Follow up in 6 months

## 2018-09-24 ENCOUNTER — Other Ambulatory Visit: Payer: Self-pay | Admitting: *Deleted

## 2018-09-24 DIAGNOSIS — Z20822 Contact with and (suspected) exposure to covid-19: Secondary | ICD-10-CM

## 2018-09-29 LAB — NOVEL CORONAVIRUS, NAA: SARS-CoV-2, NAA: NOT DETECTED

## 2019-02-19 ENCOUNTER — Other Ambulatory Visit: Payer: Self-pay

## 2019-02-19 ENCOUNTER — Emergency Department
Admission: EM | Admit: 2019-02-19 | Discharge: 2019-02-20 | Disposition: A | Discharge status: Home / Self Care | Payer: Medicare Other | Attending: Emergency Medicine | Admitting: Emergency Medicine

## 2019-02-19 ENCOUNTER — Emergency Department: Payer: Medicare Other

## 2019-02-19 DIAGNOSIS — Z79899 Other long term (current) drug therapy: Secondary | ICD-10-CM | POA: Insufficient documentation

## 2019-02-19 DIAGNOSIS — E876 Hypokalemia: Secondary | ICD-10-CM | POA: Insufficient documentation

## 2019-02-19 DIAGNOSIS — E119 Type 2 diabetes mellitus without complications: Secondary | ICD-10-CM | POA: Insufficient documentation

## 2019-02-19 DIAGNOSIS — R0789 Other chest pain: Secondary | ICD-10-CM | POA: Insufficient documentation

## 2019-02-19 DIAGNOSIS — Z7984 Long term (current) use of oral hypoglycemic drugs: Secondary | ICD-10-CM | POA: Diagnosis not present

## 2019-02-19 DIAGNOSIS — I10 Essential (primary) hypertension: Secondary | ICD-10-CM | POA: Insufficient documentation

## 2019-02-19 LAB — CBC
HCT: 38.9 % (ref 36.0–46.0)
Hemoglobin: 12.8 g/dL (ref 12.0–15.0)
MCH: 26.7 pg (ref 26.0–34.0)
MCHC: 32.9 g/dL (ref 30.0–36.0)
MCV: 81 fL (ref 80.0–100.0)
Platelets: 297 10*3/uL (ref 150–400)
RBC: 4.8 MIL/uL (ref 3.87–5.11)
RDW: 13 % (ref 11.5–15.5)
WBC: 6.7 10*3/uL (ref 4.0–10.5)
nRBC: 0 % (ref 0.0–0.2)

## 2019-02-19 LAB — BASIC METABOLIC PANEL
Anion gap: 10 (ref 5–15)
BUN: 9 mg/dL (ref 8–23)
CO2: 27 mmol/L (ref 22–32)
Calcium: 9.1 mg/dL (ref 8.9–10.3)
Chloride: 105 mmol/L (ref 98–111)
Creatinine, Ser: 0.71 mg/dL (ref 0.44–1.00)
GFR calc Af Amer: >60 mL/min (ref >60)
GFR calc non Af Amer: >60 mL/min (ref >60)
Glucose, Bld: 173 mg/dL — ABNORMAL HIGH (ref 70–99)
Potassium: 3.2 mmol/L — ABNORMAL LOW (ref 3.5–5.1)
Sodium: 142 mmol/L (ref 135–145)

## 2019-02-19 LAB — TROPONIN I (HIGH SENSITIVITY): Troponin I (High Sensitivity): 5 ng/L (ref <18)

## 2019-02-19 NOTE — ED Triage Notes (Signed)
Patient reports right upper chest pain that started earlier today, described as sharpe.  Reports pain increased with breathing and movement.  Reports had similar episode last week that resolved on its own.

## 2019-02-20 LAB — TROPONIN I (HIGH SENSITIVITY): Troponin I (High Sensitivity): 6 ng/L (ref <18)

## 2019-02-20 MED ORDER — POTASSIUM CHLORIDE CRYS ER 20 MEQ PO TBCR
40.0000 meq | EXTENDED_RELEASE_TABLET | Freq: Once | ORAL | Status: AC
  Administered 2019-02-20: 40 meq via ORAL
  Filled 2019-02-20: qty 2

## 2019-02-20 NOTE — ED Provider Notes (Signed)
[facility] Emergency Department Provider Note  ____________________________________________   First MD Initiated Contact with Patient 02/19/19 2345     (approximate)  I have reviewed the triage vital signs and the nursing notes.   HISTORY  Chief Complaint Chest Pain    HPI Diana Bishop is a 63 y.o. female with medical history as listed below who notably has no history of coronary artery disease, smoking, nor hyperlipidemia.  She presents for evaluation of acute onset right upper wall chest pain that started earlier today.  She says it is sharp, mild to moderate, and worse with movement including deep inspiration.  She thinks that it started after she carried a really heavy laundry basket that normally is carried by her son.  She has had no shortness of breath.  No known COVID-19 contacts.  She denies fever/chills, sore throat, cough, nausea, vomiting, abdominal pain, and dysuria.  She can push on the right side of her chest wall and reproduce the pain.  It started earlier today but she also had some pain last week but it was mild and resolved.  She has an appointment with her primary care doctor in about 4 days for a follow-up.   No history of blood clots in the legs of the lungs, no history of swelling in the legs.  No recent immobilizations and no long-term travel.        Past Medical History:  Diagnosis Date  . Anxiety   . Back pain   . Degenerative disc disease at L5-S1 level   . Diabetes mellitus without complication ([place])   . Fibromyalgia   . Fibromyalgia muscle pain   . Hypertension   . Neuromuscular disorder ([place])   . Neuropathy due to type 2 diabetes mellitus ([place])   . Plantar fasciitis    5/17  . Thyroid disease     Patient Active Problem List   Diagnosis Date Noted  . Plantar fasciitis 08/01/2015  . Greater trochanteric bursitis 09/13/2014  . DDD (degenerative disc disease), lumbar 08/11/2014  . Facet syndrome, lumbar  08/11/2014  . Sacroiliac joint dysfunction 08/11/2014  . Neuropathy due to secondary diabetes ([place]) 08/11/2014    Past Surgical History:  Procedure Laterality Date  . ABDOMINAL HYSTERECTOMY    . CHOLECYSTECTOMY    . COLONOSCOPY WITH PROPOFOL N/A 02/26/2018   Procedure: COLONOSCOPY WITH PROPOFOL;  Surgeon: Jonathon Bellows, MD;  Location: [facility] ENDOSCOPY;  Service: Gastroenterology;  Laterality: N/A;  . ESOPHAGOGASTRODUODENOSCOPY (EGD) WITH PROPOFOL N/A 02/26/2018   Procedure: ESOPHAGOGASTRODUODENOSCOPY (EGD) WITH PROPOFOL;  Surgeon: Jonathon Bellows, MD;  Location: [facility] ENDOSCOPY;  Service: Gastroenterology;  Laterality: N/A;  . goiter surgery    . KNEE SURGERY Bilateral     Prior to Admission medications   Medication Sig Start Date End Date Taking? Authorizing Provider  amLODipine (NORVASC) 10 MG tablet Take 10 mg by mouth daily.    [provider]  canagliflozin (INVOKANA) 100 MG TABS tablet Take 100 mg by mouth daily.    [provider]  carvedilol (COREG) 12.5 MG tablet TAKE 1 TABLET BY MOUTH TWICE DAILY FOR BLOOD PRESSURE 01/06/18   [provider]  diclofenac (FLECTOR) 1.3 % PTCH Place 1 patch onto the skin 2 (two) times daily. Patient not taking: Reported on 02/18/2018 10/05/15   Mohammed Kindle, MD  diclofenac (FLECTOR) 1.3 % [facility] Apply 1 patch to affected area twice a day if tolerated 11/02/15   Mohammed Kindle, MD  diclofenac sodium (VOLTAREN) 1 % GEL Apply 2-4  g to the painful area 4 times per day if tolerated. A maximum of 4 mg only can be applied at one time 10/05/15   Mohammed Kindle, MD  diclofenac sodium (VOLTAREN) 1 % GEL Apply 2-4 g to the painful area 4 times per day if tolerated. A maximum of 4 mg only can be applied at one time 11/02/15   Mohammed Kindle, MD  DULoxetine (CYMBALTA) 30 MG capsule Take 30 mg by mouth daily.    [provider]  famotidine (PEPCID) 40 MG tablet Take 1 tablet (40 mg total) by mouth 2 (two) times daily. 05/20/18 11/19/18  Jonathon Bellows, MD  furosemide (LASIX) 20 MG tablet Take 20 mg by mouth daily.    [provider]  glipiZIDE (GLUCOTROL) 10 MG tablet Take 10 mg by mouth 2 (two) times daily before a meal. Reported on 06/08/2015    [provider]  JANUMET 50-1000 MG tablet TAKE 1 TABLET BY MOUTH TWICE DAILY FOR DIABETES 11/25/17   [provider]  JARDIANCE 10 MG TABS tablet TAKE 1 TABLET BY MOUTH ONCE DAILY FOR DIABETES 12/05/17   [provider]  lidocaine (LIDODERM) 5 % APPLY 1 - 3 PATCHES TO SKIN FOR TWELVE HOURS ON AND TWELVE HOURS OFF. 01/23/18   [provider]  linagliptin (TRADJENTA) 5 MG TABS tablet Take 5 mg by mouth daily. Reported on 10/05/2015    [provider]  lisinopril (PRINIVIL,ZESTRIL) 40 MG tablet Take 40 mg by mouth daily.    [provider]  meloxicam (MOBIC) 7.5 MG tablet Take 1 tablet (7.5 mg total) by mouth daily. Patient not taking: Reported on 02/18/2018 09/26/15   Trula Slade, DPM  Oxycodone HCl 10 MG TABS Limit 1 tab by mouth 2-3 times per day if tolerated 11/02/15   Mohammed Kindle, MD  pregabalin (LYRICA) 150 MG capsule Take 150 mg by mouth 2 (two) times daily.    [provider]  tizanidine (ZANAFLEX) 2 MG capsule Limit 1 tab by mouth per day or twice per day if tolerated.  Please provide patient with TABLET FORM form of Zanaflex ( tizanidine ) 10/05/15   Mohammed Kindle, MD  tiZANidine (ZANAFLEX) 2 MG tablet Limit 1 tablet by mouth per day or twice per day if tolerated 11/02/15   Mohammed Kindle, MD    Allergies Patient has no known allergies.  Family History  Problem Relation Age of Onset  . GER disease Mother   . Arthritis Mother   . Diabetes Father   . Breast cancer Neg Hx     Social History Social History   Tobacco Use  . Smoking status: Never Smoker  . Smokeless tobacco: Never Used  Substance Use Topics  . Alcohol use: No    Alcohol/week: 0.0 standard drinks  . Drug use: No    Review of Systems  Constitutional: No fever/chills Eyes: No visual changes. ENT: No sore throat. Cardiovascular: Right-sided anterior chest wall pain as described above. Respiratory: Denies shortness of breath. Gastrointestinal: No abdominal pain.  No nausea, no vomiting.  No diarrhea.  No constipation. Genitourinary: Negative for dysuria. Musculoskeletal: Negative for neck pain.  Negative for back pain. Integumentary: Negative for rash. Neurological: Negative for headaches, focal weakness or numbness.   ____________________________________________   PHYSICAL EXAM:  VITAL SIGNS: ED Triage Vitals  Enc Vitals Group     BP 02/19/19 2026 (!) 155/82     Pulse Rate 02/19/19 2026 78     Resp 02/19/19 2026 17  Temp 02/19/19 2026 98.2 F (36.8 C)     Temp Source 02/19/19 2026 Oral     SpO2 02/19/19 2026 99 %     Weight 02/19/19 2023 111.1 kg (245 lb)     Height 02/19/19 2023 1.651 m (5\' 5" )     Head Circumference --      Peak Flow --      Pain Score --      Pain Loc --      Pain Edu? --      Excl. in [place]? --     Constitutional: Alert and oriented.  Well-appearing and in no acute distress. Eyes: Conjunctivae are normal.  Head: Atraumatic. Nose: No congestion/rhinnorhea. Mouth/Throat: Patient is wearing a mask. Neck: No stridor.  No meningeal signs.   Cardiovascular: Normal rate, regular rhythm. Good peripheral circulation. Grossly normal heart sounds. Respiratory: Normal respiratory effort.  No retractions. Gastrointestinal: Soft and nontender. No distention.  Musculoskeletal: Highly reproducible tenderness to palpation of the right side of her chest wall.  No lower extremity tenderness nor edema. No gross deformities of extremities. Neurologic:  Normal speech and language. No gross focal neurologic deficits are appreciated.  Skin:  Skin is warm, dry and intact.  No evidence of chest wall cellulitis nor abscess. Psychiatric: Mood and affect are normal. Speech and behavior are normal.   ____________________________________________   LABS (all labs ordered are listed, but only abnormal results are displayed)  Labs Reviewed  BASIC METABOLIC PANEL - Abnormal; Notable for the following components:      Result Value   Potassium 3.2 (*)    Glucose, Bld 173 (*)    All other components within normal limits  CBC  TROPONIN I (HIGH SENSITIVITY)  TROPONIN I (HIGH SENSITIVITY)   ____________________________________________  EKG  ED ECG REPORT I, Hinda Kehr, the attending physician, personally viewed and interpreted this ECG.  Date: 02/19/2019 EKG Time: 20: 25 Rate: 81 Rhythm: normal sinus rhythm QRS Axis: normal Intervals: Left anterior fascicular block ST/T Wave abnormalities: No concerning signs for ischemia with no T wave or ST segment changes Narrative Interpretation: no definitive evidence of acute ischemia  ____________________________________________  RADIOLOGY I, Hinda Kehr, personally viewed and evaluated these images (plain radiographs) as part of my medical decision making, as well as reviewing the written report by the radiologist.  ED MD interpretation: No acute abnormalities on chest x-ray  Official radiology report(s): Dg Chest 2 View  Result Date: 02/19/2019 CLINICAL DATA:  Chest pain. EXAM: CHEST - 2 VIEW COMPARISON:  October 27, 2006 FINDINGS: The heart size and mediastinal contours are within normal limits. Both lungs are clear. The visualized skeletal structures are unremarkable. IMPRESSION: No active cardiopulmonary disease. Electronically Signed   By: Dorise Bullion III M.D   On: 02/19/2019 20:57    ____________________________________________   PROCEDURES   Procedure(s) performed (including Critical Care):  Procedures   ____________________________________________   INITIAL IMPRESSION / MDM / Wink / ED COURSE  As part of my medical decision making, I reviewed the following data within the electronic medical  record:  History obtained from family, Labs reviewed , EKG interpreted , Old chart reviewed, Radiograph reviewed  and Notes from prior ED visits   Differential diagnosis includes, but is not limited to, musculoskeletal chest wall strain, pneumonia, viral respiratory illness, ACS, PE, pneumothorax.  Patient is low risk for ACS based on HEAR score and she has a well score for PE of 0.  Highly reproducible chest wall pain that developed  after she carried something heavy and unusual for her.  Vital signs are stable.  To negative high-sensitivity troponins.  CBC normal, chest x-ray normal, EKG with no evidence of ischemia, basic metabolic panel normal except for a slightly decreased potassium which is likely dietary given no excessive GI losses.  I counseled the patient and she feels reassured.  She will continue her regular medications and follow-up with her PCP as scheduled in a few days.  I gave my usual customary return precautions.  I am giving 1 dose of potassium 40 mEq by mouth for her slightly decreased potassium, but there is no indication for further intervention.          ____________________________________________  FINAL CLINICAL IMPRESSION(S) / ED DIAGNOSES  Final diagnoses:  Chest wall pain  Hypokalemia     MEDICATIONS GIVEN DURING THIS VISIT:  Medications  potassium chloride SA (KLOR-CON) CR tablet 40 mEq (has no administration in time range)     ED Discharge Orders    None      *Please note:  Diana Bishop was evaluated in Emergency Department on 02/20/2019 for the symptoms described in the history of present illness. She was evaluated in the context of the global COVID-19 pandemic, which necessitated consideration that the patient might be at risk for infection with the SARS-CoV-2 virus that causes COVID-19. Institutional protocols and algorithms that pertain to the evaluation of patients at risk for COVID-19 are in a state of rapid change based on information  released by regulatory bodies including the CDC and federal and state organizations. These policies and algorithms were followed during the patient's care in the ED.  Some ED evaluations and interventions may be delayed as a result of limited staffing during the pandemic.*  Note:  This document was prepared using Dragon voice recognition software and may include unintentional dictation errors.   Hinda Kehr, MD 02/20/19 [phone]

## 2019-02-20 NOTE — Discharge Instructions (Addendum)
You have been seen in the Emergency Department (ED) today for chest pain.  As we have discussed todays test results are normal, and we believe your pain is due to pain/strain and/or inflammation of the muscles and/or cartilage of your chest wall.  I recommend you take your regular medications and also consider taking ibuprofen and/or Tylenol according to label instructions.  Read through the included information for additional treatment recommendations and precautions.  The only abnormality on your lab work is a slightly decreased potassium level and we gave you a supplement in the emergency department.  You can bring this up with your primary care doctor when you follow-up but it is nothing to be concerned about at this time.  I included some information about potassium content in foods and you may consider adding some additional high potassium foods to your diet.  Return to the Emergency Department (ED) if you experience any further chest pain/pressure/tightness, difficulty breathing, or sudden sweating, or other symptoms that concern you.

## 2019-03-05 ENCOUNTER — Other Ambulatory Visit: Payer: Self-pay | Admitting: Family Medicine

## 2019-03-05 DIAGNOSIS — Z1231 Encounter for screening mammogram for malignant neoplasm of breast: Secondary | ICD-10-CM

## 2019-03-17 ENCOUNTER — Ambulatory Visit
Admission: RE | Admit: 2019-03-17 | Discharge: 2019-03-17 | Disposition: A | Discharge status: Home / Self Care | Payer: Medicare Other | Source: Ambulatory Visit | Attending: Family Medicine | Admitting: Family Medicine

## 2019-03-17 DIAGNOSIS — Z1231 Encounter for screening mammogram for malignant neoplasm of breast: Secondary | ICD-10-CM | POA: Diagnosis not present

## 2019-03-18 ENCOUNTER — Ambulatory Visit: Payer: Medicare Other | Attending: Internal Medicine

## 2019-03-18 DIAGNOSIS — Z20822 Contact with and (suspected) exposure to covid-19: Secondary | ICD-10-CM

## 2019-03-20 ENCOUNTER — Telehealth: Payer: Self-pay

## 2019-03-20 NOTE — Telephone Encounter (Signed)
Pt called for covid results and advised results are not back.

## 2019-03-22 ENCOUNTER — Telehealth: Payer: Self-pay

## 2019-03-22 NOTE — Telephone Encounter (Signed)
Caller advise result not back yet 

## 2019-03-24 LAB — NOVEL CORONAVIRUS, NAA

## 2019-06-22 ENCOUNTER — Other Ambulatory Visit: Payer: Self-pay

## 2019-06-22 MED ORDER — FAMOTIDINE 40 MG PO TABS: 40.0000 mg | ORAL_TABLET | Freq: Two times a day (BID) | ORAL | 1 refills | Status: DC

## 2019-12-20 ENCOUNTER — Other Ambulatory Visit: Payer: Self-pay | Admitting: Gastroenterology

## 2020-01-26 ENCOUNTER — Other Ambulatory Visit (HOSPITAL_COMMUNITY): Payer: Self-pay | Admitting: Family Medicine

## 2020-01-26 ENCOUNTER — Other Ambulatory Visit: Payer: Self-pay | Admitting: Family Medicine

## 2020-01-26 DIAGNOSIS — M5417 Radiculopathy, lumbosacral region: Secondary | ICD-10-CM

## 2020-01-27 ENCOUNTER — Ambulatory Visit: Payer: Medicare Other

## 2020-02-14 DIAGNOSIS — M5416 Radiculopathy, lumbar region: Secondary | ICD-10-CM | POA: Insufficient documentation

## 2020-05-06 IMAGING — MG DIGITAL SCREENING BILAT W/ TOMO W/ CAD
8 series · 8 of 24 positions shown · non-contrast
Comparison: Previous exam(s).

CLINICAL DATA: Screening.

EXAM:
DIGITAL SCREENING BILATERAL MAMMOGRAM WITH TOMO AND CAD

[L CC synth-2D]
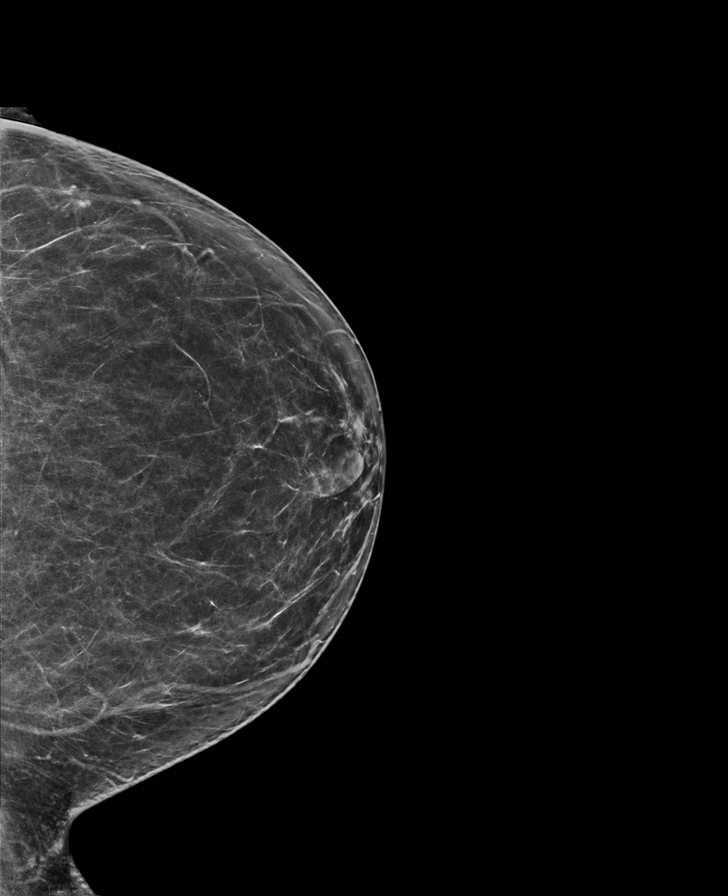

[R CC synth-2D]
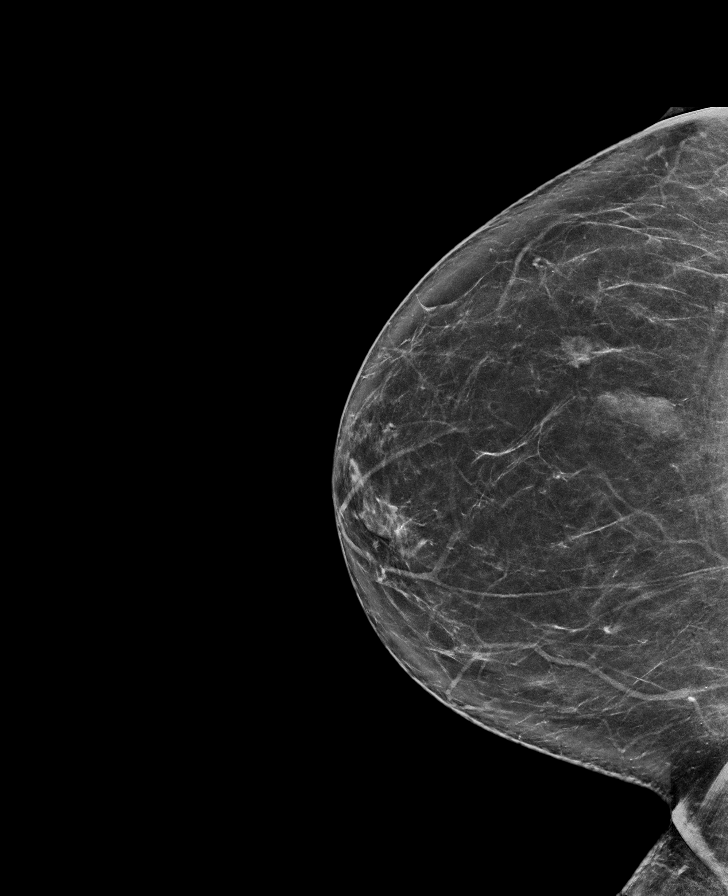

[R MLO synth-2D]
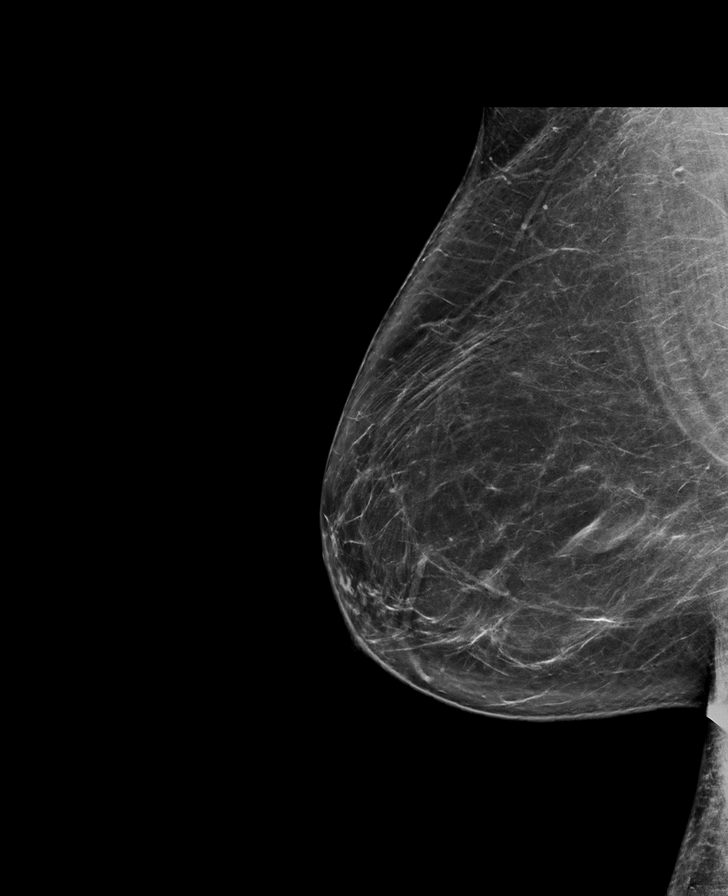

[L MLO synth-2D]
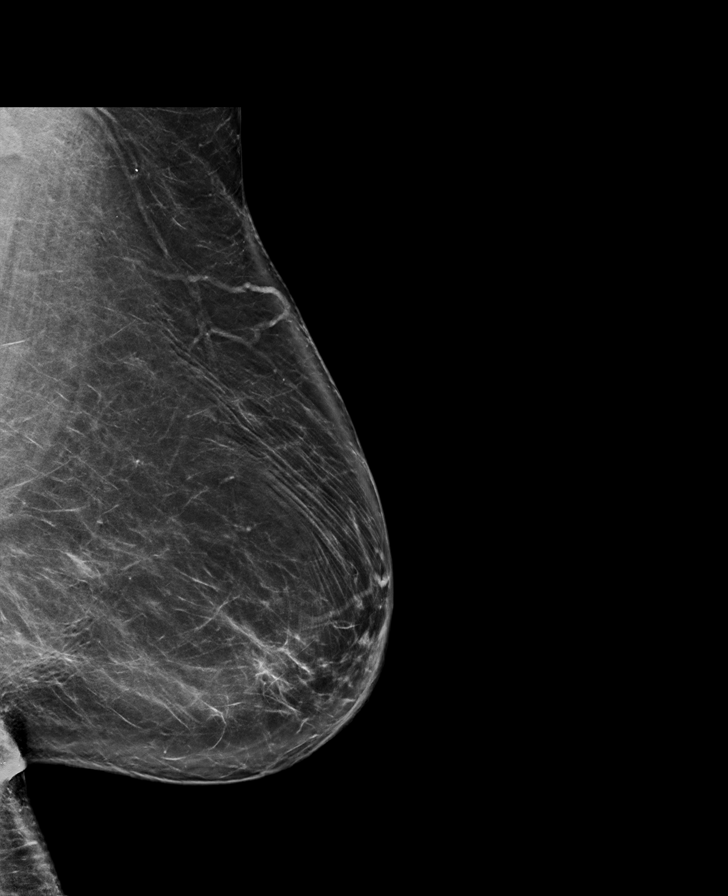

[L CC tomo · tomo slice 37/73.0]
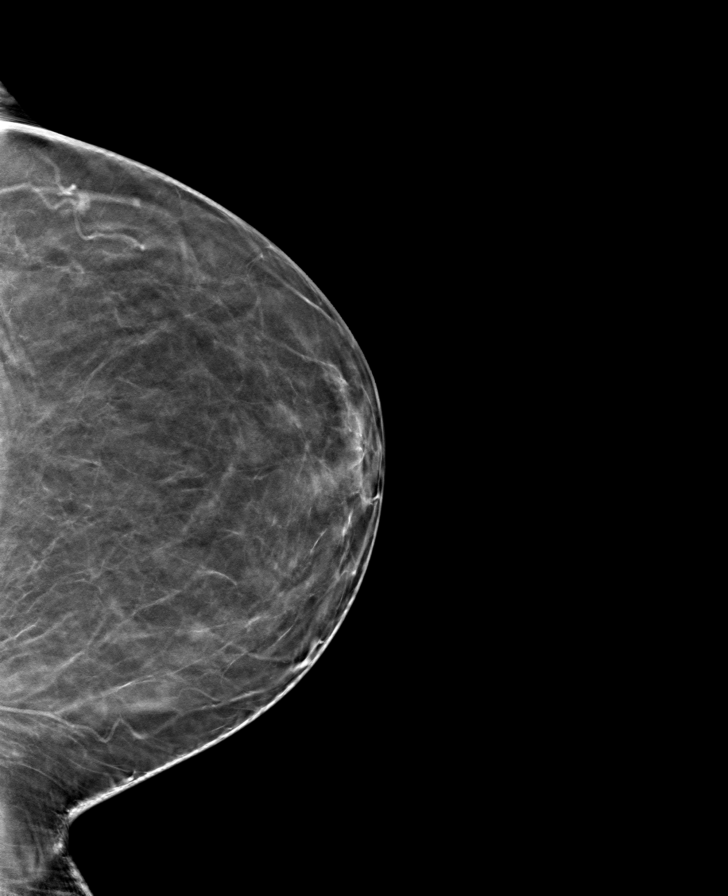

[R MLO tomo · tomo slice 45/89.0]
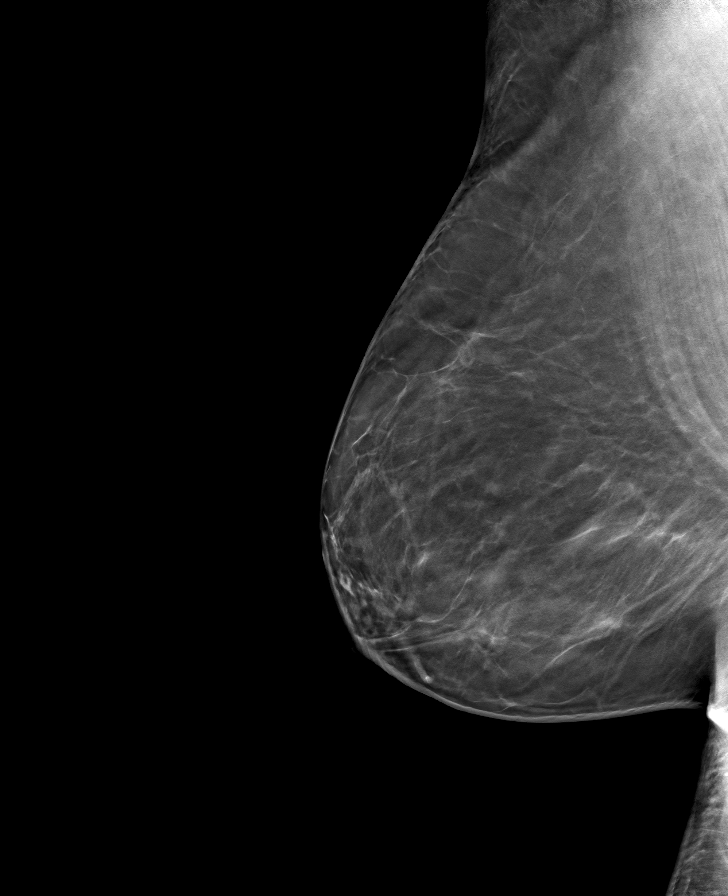

[R CC tomo · tomo slice 40/79.0]
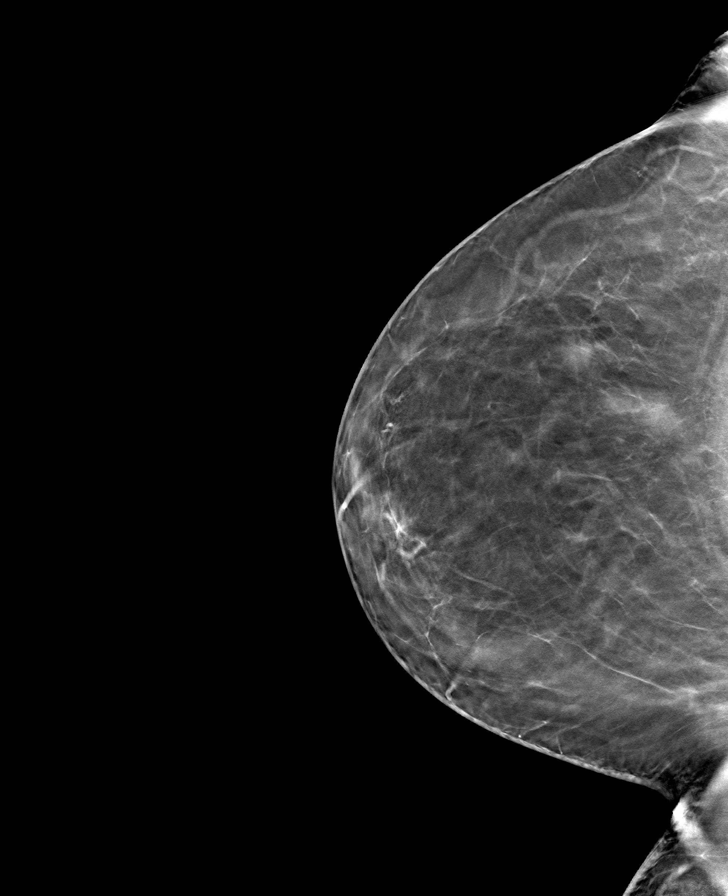

[L MLO tomo · tomo slice 47/92.0]
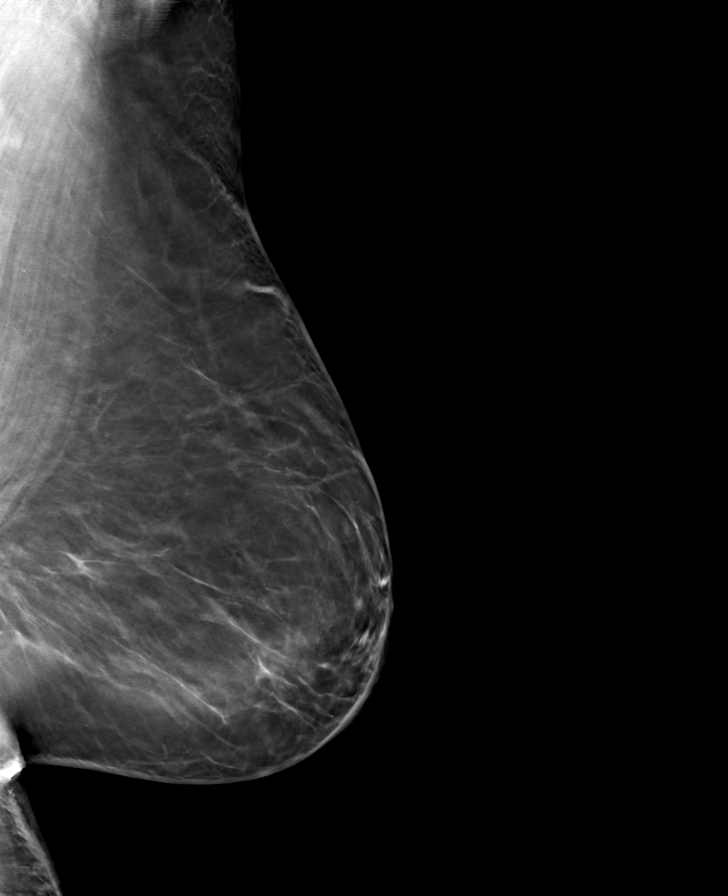

[8 of 24 positions shown; findings below may reference images not displayed]

ACR Breast Density Category b: There are scattered areas of
fibroglandular density.
FINDINGS: There are no findings suspicious for malignancy. Images were
processed with CAD.
IMPRESSION: No mammographic evidence of malignancy. A result letter of this
screening mammogram will be mailed directly to the patient.

RECOMMENDATION:
Screening mammogram in one year. (Code:[id number])

BI-RADS CATEGORY  1: Negative.

## 2020-05-29 ENCOUNTER — Ambulatory Visit: Payer: Medicare Other | Attending: Neurology

## 2020-05-29 DIAGNOSIS — R0683 Snoring: Secondary | ICD-10-CM | POA: Diagnosis present

## 2020-05-29 DIAGNOSIS — G4733 Obstructive sleep apnea (adult) (pediatric): Secondary | ICD-10-CM | POA: Insufficient documentation

## 2020-06-02 ENCOUNTER — Other Ambulatory Visit: Payer: Self-pay

## 2020-06-08 ENCOUNTER — Other Ambulatory Visit: Payer: Self-pay | Admitting: Gastroenterology

## 2020-12-15 ENCOUNTER — Other Ambulatory Visit: Payer: Self-pay | Admitting: Family Medicine

## 2020-12-15 ENCOUNTER — Other Ambulatory Visit: Payer: Self-pay | Admitting: Gastroenterology

## 2020-12-15 DIAGNOSIS — Z1231 Encounter for screening mammogram for malignant neoplasm of breast: Secondary | ICD-10-CM

## 2021-05-07 ENCOUNTER — Telehealth: Payer: Self-pay

## 2021-05-07 NOTE — Telephone Encounter (Signed)
CALLED PATIENT NO ANSWER LEFT VOICEMAIL FOR A CALL BACK °Letter sent °

## 2021-05-21 ENCOUNTER — Telehealth: Payer: Self-pay

## 2021-05-21 ENCOUNTER — Telehealth: Payer: Self-pay | Admitting: Gastroenterology

## 2021-05-21 NOTE — Telephone Encounter (Signed)
Pt has a colonosocpy question ?

## 2021-05-21 NOTE — Telephone Encounter (Signed)
Scheduled for 08/21/2021 ?

## 2021-05-21 NOTE — Telephone Encounter (Signed)
Returned patients call answered her questions and also I scheduled patient earlier today for in person office visit patient is having gi issues  ?

## 2021-05-23 ENCOUNTER — Other Ambulatory Visit: Payer: Self-pay | Admitting: Gastroenterology

## 2021-05-29 ENCOUNTER — Other Ambulatory Visit: Payer: Self-pay | Admitting: Family Medicine

## 2021-05-29 DIAGNOSIS — Z1231 Encounter for screening mammogram for malignant neoplasm of breast: Secondary | ICD-10-CM

## 2021-08-21 ENCOUNTER — Ambulatory Visit: Payer: Medicare Other | Admitting: Gastroenterology

## 2021-08-30 ENCOUNTER — Other Ambulatory Visit: Payer: Self-pay | Admitting: Family Medicine

## 2021-08-30 DIAGNOSIS — Z1231 Encounter for screening mammogram for malignant neoplasm of breast: Secondary | ICD-10-CM

## 2021-09-10 ENCOUNTER — Encounter: Payer: Self-pay | Admitting: Gastroenterology

## 2021-09-10 ENCOUNTER — Ambulatory Visit (INDEPENDENT_AMBULATORY_CARE_PROVIDER_SITE_OTHER): Payer: Medicare Other | Admitting: Gastroenterology

## 2021-09-10 ENCOUNTER — Other Ambulatory Visit: Payer: Self-pay

## 2021-09-10 VITALS — BP 128/84 | HR 85 | Temp 98.3°F | Ht 65.0 in | Wt 243.0 lb

## 2021-09-10 DIAGNOSIS — K219 Gastro-esophageal reflux disease without esophagitis: Secondary | ICD-10-CM

## 2021-09-10 MED ORDER — OMEPRAZOLE 40 MG PO CPDR: 40.0000 mg | DELAYED_RELEASE_CAPSULE | Freq: Every day | ORAL | 3 refills | Status: AC

## 2021-09-25 ENCOUNTER — Ambulatory Visit
Admission: RE | Admit: 2021-09-25 | Discharge: 2021-09-25 | Disposition: A | Discharge status: Home / Self Care | Payer: Medicare Other | Source: Ambulatory Visit | Attending: Family Medicine | Admitting: Family Medicine

## 2021-09-25 DIAGNOSIS — Z1231 Encounter for screening mammogram for malignant neoplasm of breast: Secondary | ICD-10-CM | POA: Diagnosis present

## 2022-03-04 ENCOUNTER — Other Ambulatory Visit: Payer: Self-pay

## 2022-03-07 ENCOUNTER — Ambulatory Visit: Payer: Medicare Other | Admitting: Gastroenterology

## 2022-05-02 ENCOUNTER — Encounter: Payer: Self-pay | Admitting: Gastroenterology

## 2022-05-02 ENCOUNTER — Ambulatory Visit (INDEPENDENT_AMBULATORY_CARE_PROVIDER_SITE_OTHER): Payer: Medicare Other | Admitting: Gastroenterology

## 2022-05-02 VITALS — BP 127/83 | HR 80 | Temp 98.1°F | Wt 237.0 lb

## 2022-05-02 DIAGNOSIS — K219 Gastro-esophageal reflux disease without esophagitis: Secondary | ICD-10-CM | POA: Diagnosis not present

## 2022-05-02 NOTE — Patient Instructions (Signed)
We will contact you for a one year appointment with Dr. Vicente Males. Take care.  We will call in your prescription for Omeprazole 20 MG a day in liquid form to [facility] [address], [place]

## 2022-05-02 NOTE — Progress Notes (Signed)
Jonathon Bellows MD, MRCP(U.K) [address]  [place]  [place], [place] [address]  Main: [phone]  Fax: [phone]   Primary Care Physician: Marguerita Merles, MD  Primary Gastroenterologist:  Dr. Jonathon Bellows   Chief Complaint  Patient presents with   Gastroesophageal Reflux    HPI: Diana Bishop is a 67 y.o. female   Summary of history : She has been following with me for acid reflux since March 2020. She has previously been seen by myself back in March 2020 for reflux. over 10 years 1-2 times a week.  Had been on oxycodone.  EGD back in 2019 showed no abnormalities at the GE junction and the rest of the examination was normal.  She was treated with famotidine and had been doing well.   She has had a colonoscopy in December 2019 where I resected 3 polyps less than 1 cm in size.  One was a tubular adenoma and 2 were hyperplastic.  Plan to repeat in December 2024     She states that over the past few months she has had at least 4 episodes of reflux a week.  Usually in the night or early morning.  Complains of heartburn.  She takes famotidine 40 mg a day.  Has not tried a PPI recently.  No change in weight.  She may have been eating just before her bedtime.  No other complaints.  Interval history 09/10/2021-05/02/2022  She is here today to discuss about how she is doing and if potentially possible to reduce the dose of omeprazole.  On 40 mg of omeprazole once a day works well keeps her symptoms away but she would like to obtain a liquid version as she does not like swallowing the capsule  Current Outpatient Medications  Medication Sig Dispense Refill   Accu-Chek Softclix Lancets lancets SMARTSIG:1 Topical Daily     amLODipine (NORVASC) 10 MG tablet Take 1 tablet by mouth daily.     aspirin EC 81 MG tablet Take 1 tablet by mouth daily.     atorvastatin (LIPITOR) 20 MG tablet Take 1 tablet by mouth daily.     azelastine (OPTIVAR) 0.05 % ophthalmic solution Apply 1 drop  to eye daily.     baclofen (LIORESAL) 10 MG tablet 1 tablet in the morning and at bedtime.     carvedilol (COREG) 12.5 MG tablet TAKE 1 TABLET BY MOUTH TWICE DAILY FOR BLOOD PRESSURE  3   cetirizine (ZYRTEC) 10 MG tablet Take 1 tablet by mouth daily.     cyclobenzaprine (FLEXERIL) 10 MG tablet Take 5-10 mg by mouth 2 (two) times daily.     dexlansoprazole (DEXILANT) 60 MG capsule Take 1 capsule by mouth daily.     diclofenac (FLECTOR) 1.3 % PTCH Apply 1 patch to affected area twice a day if tolerated 60 patch 0   diclofenac sodium (VOLTAREN) 1 % GEL Apply 2-4 g to the painful area 4 times per day if tolerated. A maximum of 4 mg only can be applied at one time 500 g 0   DULoxetine (CYMBALTA) 30 MG capsule Take 1 capsule by mouth at bedtime.     famotidine (PEPCID) 40 MG tablet Take 1 tablet by mouth 2 (two) times daily.     finasteride (PROSCAR) 5 MG tablet Take 0.5 tablets by mouth daily.     fluticasone (FLONASE) 50 MCG/ACT nasal spray Place 2 sprays into both nostrils daily.     furosemide (LASIX) 20 MG tablet Take 20  mg by mouth daily.     glipiZIDE (GLUCOTROL) 10 MG tablet Take 10 mg by mouth 2 (two) times daily before a meal. Reported on 06/08/2015     glucose blood (ONETOUCH ULTRA) test strip 1 each by Other route as needed.     hydrochlorothiazide (MICROZIDE) 12.5 MG capsule Take 1 capsule by mouth daily.     JANUMET 50-1000 MG tablet TAKE 1 TABLET BY MOUTH TWICE DAILY FOR DIABETES  0   JANUVIA 100 MG tablet Take 100 mg by mouth daily.     JARDIANCE 10 MG TABS tablet TAKE 1 TABLET BY MOUTH ONCE DAILY FOR DIABETES  2   Lancets (ONETOUCH ULTRASOFT) lancets Use 1 each 2 (two) times daily     levofloxacin (LEVAQUIN) 500 MG tablet Take 500 mg by mouth daily.     lidocaine (LIDODERM) 5 % APPLY 1 - 3 PATCHES TO SKIN FOR TWELVE HOURS ON AND TWELVE HOURS OFF.  2   LINZESS 145 MCG CAPS capsule Take 145 mcg by mouth every morning.     lisinopril (ZESTRIL) 40 MG tablet Take 1 tablet by mouth daily.      mometasone (ELOCON) 0.1 % lotion Apply to skin nightly.     naproxen (NAPROSYN) 500 MG tablet Take by mouth.     nitrofurantoin, macrocrystal-monohydrate, (MACROBID) 100 MG capsule      nystatin (MYCOSTATIN/NYSTOP) powder Apply topically 2 (two) times daily.     omeprazole (PRILOSEC) 40 MG capsule Take 1 capsule (40 mg total) by mouth daily. 90 capsule 3   ondansetron (ZOFRAN) 4 MG tablet TAKE 1 TABLET BY MOUTH ONCE DAILY AS NEEDED FOR NAUSEA/ VOMITING     phenazopyridine (PYRIDIUM) 100 MG tablet Take 1 tablet by mouth in the morning, at noon, and at bedtime.     phentermine (ADIPEX-P) 37.5 MG tablet Take 37.5 mg by mouth daily.     potassium chloride (KLOR-CON) 10 MEQ tablet Take by mouth.     pregabalin (LYRICA) 100 MG capsule Take by mouth.     SUDOGEST 30 MG tablet Take by mouth.     tiZANidine (ZANAFLEX) 2 MG tablet Limit 1 tablet by mouth per day or twice per day if tolerated 60 tablet 0   traMADol (ULTRAM) 50 MG tablet Take 1 tablet by mouth every 6 (six) hours as needed.     TRULICITY 1.5 0000000 SOPN Inject 1.5 mg into the skin once a week.     No current facility-administered medications for this visit.    Allergies as of 05/02/2022 - Review Complete 05/02/2022  Allergen Reaction Noted   Ace inhibitors Other (See Comments) 02/09/2020   Hydrochlorothiazide Other (See Comments) 02/09/2020   Losartan Other (See Comments) 02/09/2020    ROS:  General: Negative for anorexia, weight loss, fever, chills, fatigue, weakness. ENT: Negative for hoarseness, difficulty swallowing , nasal congestion. CV: Negative for chest pain, angina, palpitations, dyspnea on exertion, peripheral edema.  Respiratory: Negative for dyspnea at rest, dyspnea on exertion, cough, sputum, wheezing.  GI: See history of present illness. GU:  Negative for dysuria, hematuria, urinary incontinence, urinary frequency, nocturnal urination.  Endo: Negative for unusual weight change.    Physical Examination:    BP (!) 143/82   Pulse 80   Temp 98.1 F (36.7 C) (Oral)   Wt 237 lb (107.5 kg)   BMI 39.44 kg/m  Vitals:   05/02/22 1315  BP: (!) 143/82  Pulse: 80  Temp: 98.1 F (36.7 C)    General: Well-nourished, well-developed in  no acute distress.  Eyes: No icterus. Conjunctivae pink. Mouth: Oropharyngeal mucosa moist and pink , no lesions erythema or exudate. Lungs: Clear to auscultation bilaterally. Non-labored. Heart: Regular rate and rhythm, no murmurs rubs or gallops.  Abdomen: Bowel sounds are normal, nontender, nondistended, no hepatosplenomegaly or masses, no abdominal bruits or hernia , no rebound or guarding.   Extremities: No lower extremity edema. No clubbing or deformities. Neuro: Alert and oriented x 3.  Grossly intact. Skin: Warm and dry, no jaundice.   Psych: Alert and cooperative, normal mood and affect.   Imaging Studies: No results found.  Assessment and Plan:   Diana Bishop is a 67 y.o. y/o female here to follow-up for GERD.  She is on Trulicity.  Recent increase in the reflux symptoms likely due to effect of Trulicity.  On Prilosec 40 mg once a day   Plan 1.  Will reduce to 20 mg once a day liquid version  Dr Jonathon Bellows  MD,MRCP [facility]) Follow up in 1 year to determine if he can change it to Pepcid

## 2022-07-23 ENCOUNTER — Encounter (HOSPITAL_COMMUNITY): Payer: Self-pay

## 2022-07-23 ENCOUNTER — Other Ambulatory Visit: Payer: Self-pay

## 2022-07-23 ENCOUNTER — Emergency Department (HOSPITAL_COMMUNITY): Payer: Medicare Other

## 2022-07-23 ENCOUNTER — Emergency Department (HOSPITAL_COMMUNITY)
Admission: EM | Admit: 2022-07-23 | Discharge: 2022-07-23 | Disposition: A | Discharge status: Home / Self Care | Payer: Medicare Other | Attending: Emergency Medicine | Admitting: Emergency Medicine

## 2022-07-23 DIAGNOSIS — M25521 Pain in right elbow: Secondary | ICD-10-CM | POA: Diagnosis not present

## 2022-07-23 DIAGNOSIS — Z7982 Long term (current) use of aspirin: Secondary | ICD-10-CM | POA: Insufficient documentation

## 2022-07-23 DIAGNOSIS — M25551 Pain in right hip: Secondary | ICD-10-CM | POA: Insufficient documentation

## 2022-07-23 DIAGNOSIS — M25511 Pain in right shoulder: Secondary | ICD-10-CM | POA: Insufficient documentation

## 2022-07-23 DIAGNOSIS — Y9259 Other trade areas as the place of occurrence of the external cause: Secondary | ICD-10-CM | POA: Insufficient documentation

## 2022-07-23 DIAGNOSIS — W19XXXA Unspecified fall, initial encounter: Secondary | ICD-10-CM

## 2022-07-23 DIAGNOSIS — W100XXA Fall (on)(from) escalator, initial encounter: Secondary | ICD-10-CM | POA: Diagnosis not present

## 2022-07-23 DIAGNOSIS — R0782 Intercostal pain: Secondary | ICD-10-CM | POA: Diagnosis not present

## 2022-07-23 MED ORDER — IBUPROFEN 200 MG PO TABS
600.0000 mg | ORAL_TABLET | Freq: Once | ORAL | Status: AC
  Administered 2022-07-23: 600 mg via ORAL
  Filled 2022-07-23: qty 3

## 2022-07-23 NOTE — ED Triage Notes (Signed)
[place] [facility] [place]. Missed her step off the escalator railing and fell to the ground. Has right shoulder, hip, leg pain.

## 2022-07-23 NOTE — ED Provider Notes (Signed)
[place] EMERGENCY DEPARTMENT AT [facility] Provider Note   CSN: [id number] Arrival date & time: 07/23/22  1638     History  Chief Complaint  Patient presents with   Diana Bishop is a 67 y.o. female presenting today [place].  Patient reports that she was going down the escalator and getting ready to dismounts when she had a mechanical fall onto the right side of her body.  Did not strike her head and no blood thinners.  Most concerned about her right hip pain.  She was having difficulty getting up on her own and has pain that shoots up her hip when she tries to ambulate.   Fall       Home Medications Prior to Admission medications   Medication Sig Start Date End Date Taking? Authorizing Provider  Accu-Chek Softclix Lancets lancets SMARTSIG:1 Topical Daily 08/13/21   [provider]  amLODipine (NORVASC) 10 MG tablet Take 1 tablet by mouth daily. 03/31/13   [provider]  aspirin EC 81 MG tablet Take 1 tablet by mouth daily.    [provider]  atorvastatin (LIPITOR) 20 MG tablet Take 1 tablet by mouth daily. 11/25/19   [provider]  azelastine (OPTIVAR) 0.05 % ophthalmic solution Apply 1 drop to eye daily. 08/31/21   [provider]  baclofen (LIORESAL) 10 MG tablet 1 tablet in the morning and at bedtime. 08/14/21   [provider]  carvedilol (COREG) 12.5 MG tablet TAKE 1 TABLET BY MOUTH TWICE DAILY FOR BLOOD PRESSURE 01/06/18   [provider]  cetirizine (ZYRTEC) 10 MG tablet Take 1 tablet by mouth daily.    [provider]  cyclobenzaprine (FLEXERIL) 10 MG tablet Take 5-10 mg by mouth 2 (two) times daily. 04/10/21   [provider]  dexlansoprazole (DEXILANT) 60 MG capsule Take 1 capsule by mouth daily. 02/03/20   [provider]  diclofenac (FLECTOR) 1.3 % PTCH Apply 1 patch to affected area twice a day if tolerated 11/02/15   Ewing Schlein, MD   diclofenac sodium (VOLTAREN) 1 % GEL Apply 2-4 g to the painful area 4 times per day if tolerated. A maximum of 4 mg only can be applied at one time 10/05/15   Ewing Schlein, MD  DULoxetine (CYMBALTA) 30 MG capsule Take 1 capsule by mouth at bedtime. 03/31/13   [provider]  famotidine (PEPCID) 40 MG tablet Take 1 tablet by mouth 2 (two) times daily. 12/20/19   [provider]  finasteride (PROSCAR) 5 MG tablet Take 0.5 tablets by mouth daily. 11/09/19   [provider]  fluticasone (FLONASE) 50 MCG/ACT nasal spray Place 2 sprays into both nostrils daily. 08/30/21   [provider]  furosemide (LASIX) 20 MG tablet Take 20 mg by mouth daily.    [provider]  glipiZIDE (GLUCOTROL) 10 MG tablet Take 10 mg by mouth 2 (two) times daily before a meal. Reported on 06/08/2015    [provider]  glucose blood (ONETOUCH ULTRA) test strip 1 each by Other route as needed. 07/13/19   [provider]  hydrochlorothiazide (MICROZIDE) 12.5 MG capsule Take 1 capsule by mouth daily. 03/31/13   [provider]  JANUMET 50-1000 MG tablet TAKE 1 TABLET BY MOUTH TWICE DAILY FOR DIABETES 11/25/17   [provider]  JANUVIA 100 MG tablet Take 100 mg by mouth daily. 08/21/21   [provider]  JARDIANCE 10 MG  TABS tablet TAKE 1 TABLET BY MOUTH ONCE DAILY FOR DIABETES 12/05/17   [provider]  Lancets (ONETOUCH ULTRASOFT) lancets Use 1 each 2 (two) times daily 02/05/20   [provider]  levofloxacin (LEVAQUIN) 500 MG tablet Take 500 mg by mouth daily. 03/23/21   [provider]  lidocaine (LIDODERM) 5 % APPLY 1 - 3 PATCHES TO SKIN FOR TWELVE HOURS ON AND TWELVE HOURS OFF. 01/23/18   [provider]  LINZESS 145 MCG CAPS capsule Take 145 mcg by mouth every morning. 03/19/21   [provider]  lisinopril (ZESTRIL) 40 MG tablet Take 1 tablet by mouth daily. 03/31/13   [provider]   mometasone (ELOCON) 0.1 % lotion Apply to skin nightly. 12/16/19   [provider]  naproxen (NAPROSYN) 500 MG tablet Take by mouth. 01/06/20   [provider]  nitrofurantoin, macrocrystal-monohydrate, (MACROBID) 100 MG capsule  01/10/20   [provider]  nystatin (MYCOSTATIN/NYSTOP) powder Apply topically 2 (two) times daily. 08/31/21   [provider]  omeprazole (PRILOSEC) 40 MG capsule Take 1 capsule (40 mg total) by mouth daily. 09/10/21   Wyline Mood, MD  ondansetron (ZOFRAN) 4 MG tablet TAKE 1 TABLET BY MOUTH ONCE DAILY AS NEEDED FOR NAUSEA/ VOMITING 08/26/19   [provider]  phenazopyridine (PYRIDIUM) 100 MG tablet Take 1 tablet by mouth in the morning, at noon, and at bedtime.    [provider]  phentermine (ADIPEX-P) 37.5 MG tablet Take 37.5 mg by mouth daily. 08/30/21   [provider]  potassium chloride (KLOR-CON) 10 MEQ tablet Take by mouth. 03/16/19   [provider]  pregabalin (LYRICA) 100 MG capsule Take by mouth. 03/31/13   [provider]  SUDOGEST 30 MG tablet Take by mouth. 07/19/21   [provider]  tiZANidine (ZANAFLEX) 2 MG tablet Limit 1 tablet by mouth per day or twice per day if tolerated 11/02/15   Ewing Schlein, MD  traMADol (ULTRAM) 50 MG tablet Take 1 tablet by mouth every 6 (six) hours as needed. 04/14/13   [provider]  TRULICITY 1.5 MG/0.5ML SOPN Inject 1.5 mg into the skin once a week. 06/13/21   [provider]      Allergies    Ace inhibitors, Hydrochlorothiazide, and Losartan    Review of Systems   Review of Systems  Physical Exam Updated Vital Signs BP (!) 153/99   Pulse 88   Temp 98 F (36.7 C) (Oral)   Resp 18   Ht 5\' 5"  (1.651 m)   Wt 105.2 kg   SpO2 100%   BMI 38.61 kg/m  Physical Exam Vitals and nursing note reviewed.  Constitutional:      Appearance: Normal appearance.  HENT:     Head: Normocephalic and atraumatic.  Eyes:      General: No scleral icterus.    Conjunctiva/sclera: Conjunctivae normal.  Pulmonary:     Effort: Pulmonary effort is normal. No respiratory distress.  Musculoskeletal:     Comments: Full range of motion of bilateral upper and lower extremities.  Strong DP pulses as well as radial pulses.  Cranial nerves II through XII grossly intact.  Skin:    Findings: No rash.  Neurological:     Mental Status: She is alert.  Psychiatric:        Mood and Affect: Mood normal.     ED Results / Procedures / Treatments   Labs (all labs ordered are listed, but only abnormal results are displayed)  Labs Reviewed - No data to display  EKG None  Radiology DG Elbow Complete Right  Result Date: 07/23/2022 CLINICAL DATA:  Status post fall. EXAM: RIGHT ELBOW - COMPLETE 3+ VIEW COMPARISON:  None Available. FINDINGS: There is no evidence of fracture, dislocation, or joint effusion. There is no evidence of arthropathy or other focal bone abnormality. Soft tissues are unremarkable. IMPRESSION: Negative. Electronically Signed   By: Aram Candela M.D.   On: 07/23/2022 18:29   DG Ribs Unilateral W/Chest Right  Result Date: 07/23/2022 CLINICAL DATA:  Status post fall. EXAM: RIGHT RIBS AND CHEST - 3+ VIEW COMPARISON:  None Available. FINDINGS: A radiopaque marker was placed at the site of the patient's pain. No fracture or other bone lesions are seen involving the ribs. There is no evidence of pneumothorax or pleural effusion. Both lungs are clear. Heart size and mediastinal contours are within normal limits. Radiopaque surgical clips are seen overlying the right upper quadrant. IMPRESSION: Negative. Electronically Signed   By: Aram Candela M.D.   On: 07/23/2022 18:29   DG Hip Unilat W or Wo Pelvis 2-3 Views Right  Result Date: 07/23/2022 CLINICAL DATA:  Status post fall. EXAM: DG HIP (WITH OR WITHOUT PELVIS) 2-3V RIGHT COMPARISON:  None Available. FINDINGS: There is no evidence of hip fracture or dislocation.  Mild to moderate severity degenerative changes seen in the form of joint space narrowing, acetabular sclerosis and lateral acetabular bony spurring. IMPRESSION: Mild to moderate severity degenerative changes without an acute osseous abnormality. Electronically Signed   By: Aram Candela M.D.   On: 07/23/2022 18:28   DG Shoulder Right  Result Date: 07/23/2022 CLINICAL DATA:  Status post fall. EXAM: RIGHT SHOULDER - 2+ VIEW COMPARISON:  None Available. FINDINGS: There is no evidence of fracture or dislocation. There is no evidence of arthropathy or other focal bone abnormality. Soft tissues are unremarkable. IMPRESSION: Negative. Electronically Signed   By: Aram Candela M.D.   On: 07/23/2022 18:27    Procedures Procedures   Medications Ordered in ED Medications  ibuprofen (ADVIL) tablet 600 mg (has no administration in time range)    ED Course/ Medical Decision Making/ A&P                             Medical Decision Making Amount and/or Complexity of Data Reviewed Radiology: ordered.  Risk OTC drugs.   67 year old female presenting today after fall.  Differential includes but is not limited to mechanical, seizure, syncope, hypotension, hypoglycemia.  Physical exam: No bruising, lacerations or deformities.  Some tenderness to the right hip, right ribs, right shoulder and right elbow.  Normal neuroexam  Imaging: Negative  Treatment: Ibuprofen per request.  Does not want any strong medications due to her pain contract  Dispo: 67 year old female presenting today after fall.  History suggest purely mechanical fall.  Was complaining of right hip pain but on physical exam also had right shoulder, right elbow and right rib cage pain.  X-rays do not show fracture or dislocation of the wrist.  Patient was given wrist brace and discharged with NSAID, Tylenol and chronic pain medication therapy Final Clinical Impression(s) / ED Diagnoses Final diagnoses:  Fall, initial encounter     Rx / DC Orders ED Discharge Orders     None      Results and diagnoses were explained to the patient. Return precautions discussed in full. Patient had no additional questions and expressed complete understanding.  This chart was dictated using voice recognition software.  Despite best efforts to proofread,  errors can occur which can change the documentation meaning.    Woodroe Chen 07/23/22 2026    Maia Plan, MD 07/24/22 1501

## 2022-07-23 NOTE — Discharge Instructions (Signed)
All of your x-rays are normal today.  You may use Tylenol and ibuprofen for your pain as well as any stronger medication you have at home.  Use ice over your joints and heat packs over your muscles.  It was a pleasure to meet you and do not hesitate to return with any worsening symptoms.

## 2022-08-06 ENCOUNTER — Other Ambulatory Visit: Payer: Self-pay | Admitting: Family Medicine

## 2022-08-06 DIAGNOSIS — M25511 Pain in right shoulder: Secondary | ICD-10-CM

## 2022-08-15 ENCOUNTER — Ambulatory Visit
Admission: RE | Admit: 2022-08-15 | Discharge: 2022-08-15 | Disposition: A | Discharge status: Home / Self Care | Payer: Medicare Other | Source: Ambulatory Visit | Attending: Family Medicine | Admitting: Family Medicine

## 2022-08-15 DIAGNOSIS — M25511 Pain in right shoulder: Secondary | ICD-10-CM | POA: Diagnosis not present

## 2022-08-19 ENCOUNTER — Other Ambulatory Visit: Payer: Self-pay

## 2022-08-19 NOTE — Telephone Encounter (Signed)
Called patient to let her know that I called [facility] pharmacy and ordered her omeprazole prescription. Patient had no further questions.

## 2022-08-20 ENCOUNTER — Telehealth: Payer: Self-pay

## 2022-08-20 NOTE — Telephone Encounter (Signed)
Pt called stating that when she went to pick up her Omeprazole today, the pharmacist told her it needed a prior authorization. Please contact patient.

## 2022-08-21 NOTE — Telephone Encounter (Signed)
Called patient to let her know that her medication was approved by her insurance and that she is able to pick it up today. Patient was happy to hear.

## 2022-09-23 DIAGNOSIS — M76829 Posterior tibial tendinitis, unspecified leg: Secondary | ICD-10-CM | POA: Insufficient documentation

## 2022-09-26 ENCOUNTER — Other Ambulatory Visit: Payer: Self-pay | Admitting: Family Medicine

## 2022-09-26 DIAGNOSIS — Z1231 Encounter for screening mammogram for malignant neoplasm of breast: Secondary | ICD-10-CM

## 2022-12-17 ENCOUNTER — Telehealth: Payer: Self-pay

## 2022-12-17 NOTE — Telephone Encounter (Signed)
Patient is calling because she states her insurance company is requiring a PA to be done for the Omeprazole Liquid so she can pick up the medication at [facility].

## 2022-12-18 NOTE — Telephone Encounter (Signed)
Called patient again and left her a voicemail to call me back. I wanted to let her know that her insurance is no longer covering her Omeprazole 29 MG once a day liquid. If she wants it by paying out of pocket, then it will cost her $91 a month. Waiting on her call back.

## 2022-12-18 NOTE — Telephone Encounter (Signed)
Called [facility] to ask what was the problem with patient's medication and I was told by the pharmacist that patient had plenty of refills but patient's insurance is no longer covering it. I then called patient to let her know but she didn't answer. I will call her back later on today,

## 2022-12-18 NOTE — Telephone Encounter (Signed)
Called [facility] and asked if they had Omeprazole 20 MG liquid and they stated that they didn't. I then called [facility] and they do not open until 9 AM. I will call back shortly.

## 2022-12-24 NOTE — Telephone Encounter (Signed)
Called patient back and she stated that her medication had been approved by her insurance until 03/18/2023. Patient wanted  me to reach out to [facility] to let them know about the approval until 03/18/2023. I then called [facility] pharmacy to run it through and it was approved. I was told that the patient could call tomorrow to make sure that her medication was ready before showing up. I then called the patient to let her know and was happy to hear.

## 2022-12-24 NOTE — Telephone Encounter (Signed)
Patient called back wanting to know did if her refill was sent to her pharmacy to be filled.

## 2023-02-03 ENCOUNTER — Encounter: Payer: Self-pay | Admitting: Podiatry

## 2023-02-03 ENCOUNTER — Ambulatory Visit (INDEPENDENT_AMBULATORY_CARE_PROVIDER_SITE_OTHER): Payer: Medicare Other

## 2023-02-03 ENCOUNTER — Ambulatory Visit (INDEPENDENT_AMBULATORY_CARE_PROVIDER_SITE_OTHER): Payer: Medicare Other | Admitting: Podiatry

## 2023-02-03 DIAGNOSIS — S9030XA Contusion of unspecified foot, initial encounter: Secondary | ICD-10-CM

## 2023-02-03 DIAGNOSIS — S86112A Strain of other muscle(s) and tendon(s) of posterior muscle group at lower leg level, left leg, initial encounter: Secondary | ICD-10-CM | POA: Diagnosis not present

## 2023-02-03 DIAGNOSIS — S9032XA Contusion of left foot, initial encounter: Secondary | ICD-10-CM

## 2023-02-03 NOTE — Progress Notes (Signed)
Subjective:  Patient ID: Diana Bishop, female    DOB: 1955/10/26,  MRN: [id number] HPI Chief Complaint  Patient presents with   Foot Injury    Medial foot/ankle left - DOI: 07/23/2022 - [place] and [place], was treated at the ED-put in boot for 6 weeks and Rx'd meloxicam, still having a lot of aching and swelling, also feels a knot medial foot   New Patient (Initial Visit)    Est pt 2017 - diabetic - last A1c was 7.5    67 y.o. female presents with the above complaint.   ROS: Denies fever chills nausea vomit muscle aches pains calf pain back pain chest pain shortness of breath.  Past Medical History:  Diagnosis Date   Anxiety    Back pain    Degenerative disc disease at L5-S1 level    Diabetes mellitus without complication (HCC)    Fibromyalgia    Fibromyalgia muscle pain    Hypertension    Neuromuscular disorder (HCC)    Neuropathy due to type 2 diabetes mellitus (HCC)    Plantar fasciitis    5/17   Thyroid disease    Past Surgical History:  Procedure Laterality Date   ABDOMINAL HYSTERECTOMY     CHOLECYSTECTOMY     COLONOSCOPY WITH PROPOFOL N/A 02/26/2018   Procedure: COLONOSCOPY WITH PROPOFOL;  Surgeon: Wyline Mood, MD;  Location: [facility] ENDOSCOPY;  Service: Gastroenterology;  Laterality: N/A;   ESOPHAGOGASTRODUODENOSCOPY (EGD) WITH PROPOFOL N/A 02/26/2018   Procedure: ESOPHAGOGASTRODUODENOSCOPY (EGD) WITH PROPOFOL;  Surgeon: Wyline Mood, MD;  Location: [facility] ENDOSCOPY;  Service: Gastroenterology;  Laterality: N/A;   goiter surgery     KNEE SURGERY Bilateral     Current Outpatient Medications:    Accu-Chek Softclix Lancets lancets, SMARTSIG:1 Topical Daily, Disp: , Rfl:    amLODipine (NORVASC) 10 MG tablet, Take 1 tablet by mouth daily., Disp: , Rfl:    aspirin EC 81 MG tablet, Take 1 tablet by mouth daily., Disp: , Rfl:    atorvastatin (LIPITOR) 20 MG tablet, Take 1 tablet by mouth daily., Disp: , Rfl:    azelastine (OPTIVAR) 0.05 % ophthalmic  solution, Apply 1 drop to eye daily., Disp: , Rfl:    baclofen (LIORESAL) 10 MG tablet, 1 tablet in the morning and at bedtime., Disp: , Rfl:    carvedilol (COREG) 12.5 MG tablet, TAKE 1 TABLET BY MOUTH TWICE DAILY FOR BLOOD PRESSURE, Disp: , Rfl: 3   cetirizine (ZYRTEC) 10 MG tablet, Take 1 tablet by mouth daily., Disp: , Rfl:    cyclobenzaprine (FLEXERIL) 10 MG tablet, Take 5-10 mg by mouth 2 (two) times daily., Disp: , Rfl:    dexlansoprazole (DEXILANT) 60 MG capsule, Take 1 capsule by mouth daily., Disp: , Rfl:    diclofenac (FLECTOR) 1.3 % PTCH, Apply 1 patch to affected area twice a day if tolerated, Disp: 60 patch, Rfl: 0   diclofenac sodium (VOLTAREN) 1 % GEL, Apply 2-4 g to the painful area 4 times per day if tolerated. A maximum of 4 mg only can be applied at one time, Disp: 500 g, Rfl: 0   DULoxetine (CYMBALTA) 30 MG capsule, Take 1 capsule by mouth at bedtime., Disp: , Rfl:    famotidine (PEPCID) 40 MG tablet, Take 1 tablet by mouth 2 (two) times daily., Disp: , Rfl:    finasteride (PROSCAR) 5 MG tablet, Take 0.5 tablets by mouth daily., Disp: , Rfl:    fluticasone (FLONASE) 50 MCG/ACT nasal spray, Place 2 sprays into  both nostrils daily., Disp: , Rfl:    furosemide (LASIX) 20 MG tablet, Take 20 mg by mouth daily., Disp: , Rfl:    glipiZIDE (GLUCOTROL) 10 MG tablet, Take 10 mg by mouth 2 (two) times daily before a meal. Reported on 06/08/2015, Disp: , Rfl:    glucose blood (ONETOUCH ULTRA) test strip, 1 each by Other route as needed., Disp: , Rfl:    hydrochlorothiazide (MICROZIDE) 12.5 MG capsule, Take 1 capsule by mouth daily., Disp: , Rfl:    JANUMET 50-1000 MG tablet, TAKE 1 TABLET BY MOUTH TWICE DAILY FOR DIABETES, Disp: , Rfl: 0   JANUVIA 100 MG tablet, Take 100 mg by mouth daily., Disp: , Rfl:    JARDIANCE 10 MG TABS tablet, TAKE 1 TABLET BY MOUTH ONCE DAILY FOR DIABETES, Disp: , Rfl: 2   Lancets (ONETOUCH ULTRASOFT) lancets, Use 1 each 2 (two) times daily, Disp: , Rfl:     levofloxacin (LEVAQUIN) 500 MG tablet, Take 500 mg by mouth daily., Disp: , Rfl:    lidocaine (LIDODERM) 5 %, APPLY 1 - 3 PATCHES TO SKIN FOR TWELVE HOURS ON AND TWELVE HOURS OFF., Disp: , Rfl: 2   LINZESS 145 MCG CAPS capsule, Take 145 mcg by mouth every morning., Disp: , Rfl:    lisinopril (ZESTRIL) 40 MG tablet, Take 1 tablet by mouth daily., Disp: , Rfl:    meloxicam (MOBIC) 15 MG tablet, Take 15 mg by mouth daily., Disp: , Rfl:    minoxidil (LONITEN) 2.5 MG tablet, Take 1.25 mg by mouth daily., Disp: , Rfl:    mometasone (ELOCON) 0.1 % lotion, Apply to skin nightly., Disp: , Rfl:    naproxen (NAPROSYN) 500 MG tablet, Take by mouth., Disp: , Rfl:    nitrofurantoin, macrocrystal-monohydrate, (MACROBID) 100 MG capsule, , Disp: , Rfl:    nystatin (MYCOSTATIN/NYSTOP) powder, Apply topically 2 (two) times daily., Disp: , Rfl:    omeprazole (PRILOSEC) 40 MG capsule, Take 1 capsule (40 mg total) by mouth daily., Disp: 90 capsule, Rfl: 3   ondansetron (ZOFRAN) 4 MG tablet, TAKE 1 TABLET BY MOUTH ONCE DAILY AS NEEDED FOR NAUSEA/ VOMITING, Disp: , Rfl:    phenazopyridine (PYRIDIUM) 100 MG tablet, Take 1 tablet by mouth in the morning, at noon, and at bedtime., Disp: , Rfl:    phentermine (ADIPEX-P) 37.5 MG tablet, Take 37.5 mg by mouth daily., Disp: , Rfl:    potassium chloride (KLOR-CON) 10 MEQ tablet, Take by mouth., Disp: , Rfl:    pregabalin (LYRICA) 100 MG capsule, Take by mouth., Disp: , Rfl:    SUDOGEST 30 MG tablet, Take by mouth., Disp: , Rfl:    tiZANidine (ZANAFLEX) 2 MG tablet, Limit 1 tablet by mouth per day or twice per day if tolerated, Disp: 60 tablet, Rfl: 0   traMADol (ULTRAM) 50 MG tablet, Take 1 tablet by mouth every 6 (six) hours as needed., Disp: , Rfl:    TRULICITY 1.5 MG/0.5ML SOPN, Inject 1.5 mg into the skin once a week., Disp: , Rfl:   Allergies  Allergen Reactions   Ace Inhibitors Other (See Comments)   Hydrochlorothiazide Other (See Comments)   Losartan Other (See  Comments)   Review of Systems Objective:  There were no vitals filed for this visit.  General: Well developed, nourished, in no acute distress, alert and oriented x3   Dermatological: Skin is warm, dry and supple bilateral. Nails x 10 are well maintained; remaining integument appears unremarkable at this time. There are no open  sores, no preulcerative lesions, no rash or signs of infection present.  Vascular: Dorsalis Pedis artery and Posterior Tibial artery pedal pulses are 2/4 bilateral with immedate capillary fill time. Pedal hair growth present. No varicosities and no lower extremity edema present bilateral.   Neruologic: Grossly intact via light touch bilateral. Vibratory intact via tuning fork bilateral. Protective threshold with Semmes Wienstein monofilament intact to all pedal sites bilateral. Patellar and Achilles deep tendon reflexes 2+ bilateral. No Babinski or clonus noted bilateral.   Musculoskeletal: No gross boney pedal deformities bilateral. No pain, crepitus, or limitation noted with foot and ankle range of motion bilateral.  Muscle strength 5/5 dorsiflexors plantar flexors and everters.  However her inverter is about a 3 out of 5.  Posterior tibial tendon is exquisitely painful and contains fluid on palpation.   Gait: Unassisted, Nonantalgic.    Radiographs:  Radiographs taken today do not demonstrate any significant osseous abnormalities.  She does have soft tissue swelling around the medial aspect of the navicular and medial foot.  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  Plan: Discussed etiology pathology conservative versus surgical therapies.  She has been in a boot she is taken anti-inflammatories all to no avail nothing seems to be helping.  She states that is affecting her ability perform her daily activities and stay healthy.  I want her to get back into her cam  boot and we are going to request an MRI of the posterior foot.  We are going to look at the posterior tibial tendon make sure that that is not torn.  If there is no tear then physical therapy will be necessary with a possible insole however I do highly suspect a tear of this posterior tibial tendon.     Jayah Balthazar T. [place], [place]

## 2023-03-14 ENCOUNTER — Ambulatory Visit
Admission: RE | Admit: 2023-03-14 | Discharge: 2023-03-14 | Disposition: A | Discharge status: Home / Self Care | Payer: Medicare Other | Source: Ambulatory Visit | Attending: Podiatry | Admitting: Podiatry

## 2023-03-14 DIAGNOSIS — S86112A Strain of other muscle(s) and tendon(s) of posterior muscle group at lower leg level, left leg, initial encounter: Secondary | ICD-10-CM

## 2023-04-04 ENCOUNTER — Other Ambulatory Visit: Payer: Self-pay | Admitting: Family Medicine

## 2023-04-04 DIAGNOSIS — Z1231 Encounter for screening mammogram for malignant neoplasm of breast: Secondary | ICD-10-CM

## 2023-04-14 ENCOUNTER — Encounter: Payer: Self-pay | Admitting: Podiatry

## 2023-04-14 ENCOUNTER — Ambulatory Visit (INDEPENDENT_AMBULATORY_CARE_PROVIDER_SITE_OTHER): Payer: Medicare Other | Admitting: Podiatry

## 2023-04-14 DIAGNOSIS — M65972 Unspecified synovitis and tenosynovitis, left ankle and foot: Secondary | ICD-10-CM

## 2023-04-14 NOTE — Progress Notes (Signed)
She presents today for follow-up of her posterior tibial tendon tear and chronic pain that she has had in her left medial foot and ankle.  She states that she is starting to feel better as of late and has not had much pain in the past month.  States that her shoes are actually feeling a lot better to wear.  Objective: Vital signs are stable alert oriented x 3.  She has some tenderness on palpation of the talonavicular joint and some tenderness with fluctuance on palpation of the posterior tibial tendon but no pain on inversion against resistance.  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  Plan: Discussed appropriate shoe gear with her today and discussed possible injection therapy as well as possible surgical intervention.

## 2023-04-21 ENCOUNTER — Encounter: Payer: Self-pay | Admitting: *Deleted

## 2024-02-10 ENCOUNTER — Telehealth: Payer: Self-pay

## 2024-02-10 ENCOUNTER — Other Ambulatory Visit: Payer: Self-pay

## 2024-02-10 DIAGNOSIS — Z860100 Personal history of colon polyps, unspecified: Secondary | ICD-10-CM

## 2024-02-10 MED ORDER — NA SULFATE-K SULFATE-MG SULF 17.5-3.13-1.6 GM/177ML PO SOLN: 354.0000 mL | Freq: Once | ORAL | 0 refills | Status: AC

## 2024-02-10 NOTE — Telephone Encounter (Signed)
 Gastroenterology Pre-Procedure Review  Request Date: 03/17/2024 Requesting Physician: Dr. Dr. Melany  PATIENT REVIEW QUESTIONS: The patient responded to the following health history questions as indicated:    1. Are you having any GI issues? no 2. Do you have a personal history of Polyps? Yes 3. Do you have a family history of Colon Cancer or Polyps? no 4. Diabetes Mellitus? yes (Glipizide, ) 5. Joint replacements in the past 12 months?no 6. Major health problems in the past 3 months?no 7. Any artificial heart valves, MVP, or defibrillator?no    MEDICATIONS & ALLERGIES:    Patient reports the following regarding taking any anticoagulation/antiplatelet therapy:   Plavix, Coumadin, Eliquis, Xarelto, Lovenox, Pradaxa, Brilinta, or Effient? no Aspirin? yes (ASA 81 MG)  Patient confirms/reports the following medications:  Current Outpatient Medications  Medication Sig Dispense Refill   Accu-Chek Softclix Lancets lancets SMARTSIG:1 Topical Daily     amLODipine (NORVASC) 10 MG tablet Take 1 tablet by mouth daily.     aspirin EC 81 MG tablet Take 1 tablet by mouth daily.     atorvastatin (LIPITOR) 20 MG tablet Take 1 tablet by mouth daily.     azelastine (OPTIVAR) 0.05 % ophthalmic solution Apply 1 drop to eye daily.     baclofen (LIORESAL) 10 MG tablet 1 tablet in the morning and at bedtime.     carvedilol (COREG) 12.5 MG tablet TAKE 1 TABLET BY MOUTH TWICE DAILY FOR BLOOD PRESSURE  3   cetirizine (ZYRTEC) 10 MG tablet Take 1 tablet by mouth daily.     cyclobenzaprine (FLEXERIL) 10 MG tablet Take 5-10 mg by mouth 2 (two) times daily.     dexlansoprazole (DEXILANT) 60 MG capsule Take 1 capsule by mouth daily.     diclofenac  (FLECTOR ) 1.3 % PTCH Apply 1 patch to affected area twice a day if tolerated 60 patch 0   diclofenac  sodium (VOLTAREN ) 1 % GEL Apply 2-4 g to the painful area 4 times per day if tolerated. A maximum of 4 mg only can be applied at one time 500 g 0   DULoxetine (CYMBALTA)  30 MG capsule Take 1 capsule by mouth at bedtime.     famotidine  (PEPCID ) 40 MG tablet Take 1 tablet by mouth 2 (two) times daily.     finasteride (PROSCAR) 5 MG tablet Take 0.5 tablets by mouth daily.     fluticasone (FLONASE) 50 MCG/ACT nasal spray Place 2 sprays into both nostrils daily.     furosemide (LASIX) 20 MG tablet Take 20 mg by mouth daily.     glipiZIDE (GLUCOTROL) 10 MG tablet Take 10 mg by mouth 2 (two) times daily before a meal. Reported on 06/08/2015     glucose blood (ONETOUCH ULTRA) test strip 1 each by Other route as needed.     hydrochlorothiazide (MICROZIDE) 12.5 MG capsule Take 1 capsule by mouth daily.     JANUMET 50-1000 MG tablet TAKE 1 TABLET BY MOUTH TWICE DAILY FOR DIABETES  0   JANUVIA 100 MG tablet Take 100 mg by mouth daily.     JARDIANCE 10 MG TABS tablet TAKE 1 TABLET BY MOUTH ONCE DAILY FOR DIABETES  2   Lancets (ONETOUCH ULTRASOFT) lancets Use 1 each 2 (two) times daily     levofloxacin (LEVAQUIN) 500 MG tablet Take 500 mg by mouth daily.     lidocaine (LIDODERM) 5 % APPLY 1 - 3 PATCHES TO SKIN FOR TWELVE HOURS ON AND TWELVE HOURS OFF.  2   LINZESS 145 MCG  CAPS capsule Take 145 mcg by mouth every morning.     lisinopril (ZESTRIL) 40 MG tablet Take 1 tablet by mouth daily.     meloxicam  (MOBIC ) 15 MG tablet Take 15 mg by mouth daily.     minoxidil (LONITEN) 2.5 MG tablet Take 1.25 mg by mouth daily.     mometasone (ELOCON) 0.1 % lotion Apply to skin nightly.     naproxen (NAPROSYN) 500 MG tablet Take by mouth.     nitrofurantoin, macrocrystal-monohydrate, (MACROBID) 100 MG capsule      nystatin (MYCOSTATIN/NYSTOP) powder Apply topically 2 (two) times daily.     omeprazole  (PRILOSEC) 40 MG capsule Take 1 capsule (40 mg total) by mouth daily. 90 capsule 3   ondansetron (ZOFRAN) 4 MG tablet TAKE 1 TABLET BY MOUTH ONCE DAILY AS NEEDED FOR NAUSEA/ VOMITING     phenazopyridine (PYRIDIUM) 100 MG tablet Take 1 tablet by mouth in the morning, at noon, and at bedtime.      phentermine (ADIPEX-P) 37.5 MG tablet Take 37.5 mg by mouth daily.     potassium chloride  (KLOR-CON ) 10 MEQ tablet Take by mouth.     pregabalin (LYRICA) 100 MG capsule Take by mouth.     SUDOGEST 30 MG tablet Take by mouth.     tiZANidine  (ZANAFLEX ) 2 MG tablet Limit 1 tablet by mouth per day or twice per day if tolerated 60 tablet 0   traMADol (ULTRAM) 50 MG tablet Take 1 tablet by mouth every 6 (six) hours as needed.     TRULICITY 1.5 MG/0.5ML SOPN Inject 1.5 mg into the skin once a week.     No current facility-administered medications for this visit.    Patient confirms/reports the following allergies:  Allergies  Allergen Reactions   Ace Inhibitors Other (See Comments)   Hydrochlorothiazide Other (See Comments)   Losartan Other (See Comments)    No orders of the defined types were placed in this encounter.   AUTHORIZATION INFORMATION Primary Insurance: 1D#: Group #:  Secondary Insurance: 1D#: Group #:  SCHEDULE INFORMATION: Date: 03/17/2024 Time: Location: [facility] Dr. Melany

## 2024-02-23 ENCOUNTER — Ambulatory Visit
Admission: RE | Admit: 2024-02-23 | Discharge: 2024-02-23 | Disposition: A | Source: Ambulatory Visit | Attending: Family Medicine | Admitting: Family Medicine

## 2024-02-23 DIAGNOSIS — Z1231 Encounter for screening mammogram for malignant neoplasm of breast: Secondary | ICD-10-CM

## 2024-02-24 ENCOUNTER — Encounter

## 2024-03-09 ENCOUNTER — Encounter: Payer: Self-pay | Admitting: Gastroenterology

## 2024-03-09 NOTE — Anesthesia Preprocedure Evaluation (Addendum)
"                                    Anesthesia Evaluation  Patient identified by MRN, date of birth, ID band Patient awake    Reviewed: Allergy & Precautions, H&P , NPO status , Patient's Chart, lab work & pertinent test results  Airway Mallampati: II  TM Distance: >3 FB Neck ROM: Full    Dental no notable dental hx. (+) Teeth Intact   Pulmonary neg pulmonary ROS   Pulmonary exam normal breath sounds clear to auscultation       Cardiovascular hypertension, negative cardio ROS Normal cardiovascular exam Rhythm:Regular Rate:Normal     Neuro/Psych   Anxiety      Neuromuscular disease negative neurological ROS  negative psych ROS   GI/Hepatic negative GI ROS, Neg liver ROS,GERD  ,,  Endo/Other  negative endocrine ROSdiabetes    Renal/GU negative Renal ROS  negative genitourinary   Musculoskeletal negative musculoskeletal ROS (+) Arthritis ,  Fibromyalgia -  Abdominal   Peds negative pediatric ROS (+)  Hematology negative hematology ROS (+)   Anesthesia Other Findings Anxiety  Neuromuscular disorder  Diabetes mellitus without complication Hypertension Fibromyalgia  Thyroid disease Neuropathy due to type 2 diabetes mellitus Fibromyalgia muscle pain Plantar fasciitis  Back pain Degenerative disc disease at L5-S1 level Chronic pain Lumbar radiculopathy   Reproductive/Obstetrics negative OB ROS                              Anesthesia Physical Anesthesia Plan  ASA: 3  Anesthesia Plan: General   Post-op Pain Management:    Induction: Intravenous  PONV Risk Score and Plan:   Airway Management Planned: Natural Airway and Nasal Cannula  Additional Equipment:   Intra-op Plan:   Post-operative Plan: Extubation in OR  Informed Consent: I have reviewed the patients History and Physical, chart, labs and discussed the procedure including the risks, benefits and alternatives for the proposed anesthesia with the patient  or authorized representative who has indicated his/her understanding and acceptance.     Dental Advisory Given  Plan Discussed with: Anesthesiologist, CRNA and Surgeon  Anesthesia Plan Comments: (Patient consented for risks of anesthesia including but not limited to:  - adverse reactions to medications - risk of airway placement if required - damage to eyes, teeth, lips or other oral mucosa - nerve damage due to positioning  - sore throat or hoarseness - Damage to heart, brain, nerves, lungs, other parts of body or loss of life  Patient voiced understanding and assent.)         Anesthesia Quick Evaluation  "

## 2024-03-15 ENCOUNTER — Telehealth: Payer: Self-pay

## 2024-03-15 NOTE — Telephone Encounter (Signed)
 Patient has rescheduled her colonoscopy from 03/17/24 to 04/07/24 at [facility] still with Dr. Melany.  Instructions updated.  Referral updated.  Thanks,  Rosaline CMA

## 2024-04-02 ENCOUNTER — Telehealth: Payer: Self-pay

## 2024-04-02 ENCOUNTER — Encounter: Payer: Self-pay | Admitting: Gastroenterology

## 2024-04-02 NOTE — Telephone Encounter (Signed)
 Per Erminio, RN at [facility],   Ms Joss doesn't have instructions. Could you reach out to her and get her email and send them to her please. She is on Ozempic and due to take her shot today . I told her if you do surgery will be cancelled because she has to be off it for a week. May want to go over that again. She also takes two other diabetic meds and ASA., Just a heads up on those meds.   Her mailbox is full. I was able to lvm on the second phone number-informed her that her instructions are located in her mychart and she can call office if she is unable to locate them or need additional assistance   Erminio said she does not have access to her mychart but has email.  Chart checked for email.  Email : [email or web address]  I will email her.  Thanks,  Rosaline CMA

## 2024-04-06 MED ORDER — NA SULFATE-K SULFATE-MG SULF 17.5-3.13-1.6 GM/177ML PO SOLN: 354.0000 mL | Freq: Once | ORAL | 0 refills | Status: AC

## 2024-04-06 NOTE — Addendum Note (Signed)
 Addended by: JODIE HEADINGS on: 04/06/2024 10:47 AM   Modules accepted: Orders

## 2024-04-06 NOTE — H&P (Signed)
 "  Diana Schaffer, MD [facility] [address] [place], [place] [address] [phone] Fax : [phone]  Primary Care Physician:  Buren Rock HERO, MD Primary Gastroenterologist:  Dr. Schaffer  Pre-Procedure History & Physical: HPI:  Diana Bishop is a 69 y.o. female is here for an colonoscopy to be performed for surveillance of an AP removed from the transverse in 2019.  Fhx CRC? Blood thinners? No   Past Medical History:  Diagnosis Date   Anxiety    Arthritis    Back pain    Chronic pain    Degenerative disc disease at L5-S1 level    Diabetes mellitus without complication (HCC)    Fibromyalgia    Fibromyalgia muscle pain    GERD (gastroesophageal reflux disease)    Hypertension    Lumbar radiculopathy    Neuromuscular disorder (HCC)    Neuropathy due to type 2 diabetes mellitus (HCC)    Plantar fasciitis    5/17   Sleep apnea    no cpap use   Thyroid disease     Past Surgical History:  Procedure Laterality Date   ABDOMINAL HYSTERECTOMY     CHOLECYSTECTOMY     COLONOSCOPY WITH PROPOFOL  N/A 02/26/2018   Procedure: COLONOSCOPY WITH PROPOFOL ;  Surgeon: Therisa Bi, MD;  Location: [facility] ENDOSCOPY;  Service: Gastroenterology;  Laterality: N/A;   ESOPHAGOGASTRODUODENOSCOPY (EGD) WITH PROPOFOL  N/A 02/26/2018   Procedure: ESOPHAGOGASTRODUODENOSCOPY (EGD) WITH PROPOFOL ;  Surgeon: Therisa Bi, MD;  Location: [facility] ENDOSCOPY;  Service: Gastroenterology;  Laterality: N/A;   goiter surgery     KNEE SURGERY Bilateral     Prior to Admission medications  Medication Sig Start Date End Date Taking? Authorizing Provider  Oxycodone  HCl 10 MG TABS Take 10 mg by mouth as needed (3 x daily prn).   Yes [provider]  Semaglutide, 2 MG/DOSE, (OZEMPIC, 2 MG/DOSE,) 8 MG/3ML SOPN Inject into the skin once a week.   Yes [provider]  Accu-Chek Softclix Lancets lancets SMARTSIG:1 Topical Daily 08/13/21   [provider]  amLODipine (NORVASC) 10 MG  tablet Take 1 tablet by mouth daily. 03/31/13   [provider]  aspirin EC 81 MG tablet Take 1 tablet by mouth daily.    [provider]  carvedilol (COREG) 12.5 MG tablet TAKE 1 TABLET BY MOUTH TWICE DAILY FOR BLOOD PRESSURE 01/06/18   [provider]  diclofenac  sodium (VOLTAREN ) 1 % GEL Apply 2-4 g to the painful area 4 times per day if tolerated. A maximum of 4 mg only can be applied at one time Patient not taking: Reported on 04/02/2024 10/05/15   Dannial Hacker, MD  DULoxetine (CYMBALTA) 30 MG capsule Take 1 capsule by mouth at bedtime. 03/31/13   [provider]  furosemide (LASIX) 20 MG tablet Take 20 mg by mouth daily.    [provider]  glipiZIDE (GLUCOTROL) 10 MG tablet Take 10 mg by mouth 2 (two) times daily before a meal. Reported on 06/08/2015    [provider]  glucose blood (ONETOUCH ULTRA) test strip 1 each by Other route as needed. 07/13/19   [provider]  JANUVIA 100 MG tablet Take 100 mg by mouth daily. 08/21/21   [provider]  Lancets JANETT ULTRASOFT) lancets Use 1 each 2 (two) times daily 02/05/20   [provider]  lidocaine  (LIDODERM ) 5 % APPLY 1 - 3 PATCHES TO SKIN FOR TWELVE HOURS ON AND TWELVE HOURS OFF. 01/23/18   [provider]  VESTA 145  MCG CAPS capsule Take 145 mcg by mouth as needed. 03/19/21   [provider]  Na Sulfate-K Sulfate-Mg Sulfate concentrate (SUPREP) 17.5-3.13-1.6 GM/177ML SOLN Take 1 kit (354 mLs total) by mouth once for 1 dose. Starting at 5 PM take one bottle and pour into the supplied cup, add cool water  to the fill 16 oz line and drink all. Then 5 hours before procedure pour the second bottle into the supplied cup, add cool water  to the fill 16 oz line and drink all. 04/06/24 04/06/24  Diana Diana HERO, MD  omeprazole  (PRILOSEC) 40 MG capsule Take 1 capsule (40 mg total) by mouth daily. 09/10/21   Anna, Kiran, MD  ondansetron (ZOFRAN) 4 MG tablet TAKE  1 TABLET BY MOUTH ONCE DAILY AS NEEDED FOR NAUSEA/ VOMITING 08/26/19   [provider]  potassium chloride  (KLOR-CON ) 10 MEQ tablet Take by mouth as needed. 03/16/19   [provider]  pregabalin (LYRICA) 100 MG capsule Take by mouth as needed. 03/31/13   [provider]  SUDOGEST 30 MG tablet Take by mouth every 6 (six) hours as needed. 07/19/21   [provider]    Allergies as of 02/10/2024 - Review Complete 04/14/2023  Allergen Reaction Noted   Ace inhibitors Other (See Comments) 02/09/2020   Hydrochlorothiazide Other (See Comments) 02/09/2020   Losartan Other (See Comments) 02/09/2020    Family History  Problem Relation Age of Onset   GER disease Mother    Arthritis Mother    Diabetes Father    Breast cancer Neg Hx     Social History   Socioeconomic History   Marital status: Married    Spouse name: Not on file   Number of children: Not on file   Years of education: Not on file   Highest education level: Not on file  Occupational History   Not on file  Tobacco Use   Smoking status: Never   Smokeless tobacco: Never  Vaping Use   Vaping status: Never Used  Substance and Sexual Activity   Alcohol use: Yes    Comment: occaisionally   Drug use: Never   Sexual activity: Not on file  Other Topics Concern   Not on file  Social History Narrative   Not on file   Social Drivers of Health   Tobacco Use: Low Risk (04/02/2024)   Patient History    Smoking Tobacco Use: Never    Smokeless Tobacco Use: Never    Passive Exposure: Not on file  Financial Resource Strain: Low Risk  (01/01/2023)   Received from [facility]   Overall Financial Resource Strain (CARDIA)    Difficulty of Paying Living Expenses: Not hard at all  Food Insecurity: No Food Insecurity (01/01/2023)   Received from [facility]   Epic    Within the past 12 months, you worried that your food would run out before you got the money to buy  more.: Never true    Within the past 12 months, the food you bought just didn't last and you didn't have money to get more.: Never true  Transportation Needs: No Transportation Needs (01/01/2023)   Received from [facility] - Transportation    In the past 12 months, has lack of transportation kept you from medical appointments or from getting medications?: No    Lack of Transportation (Non-Medical): No  Physical Activity: Not on file  Stress: Not on file  Social Connections: Not on file  Intimate Partner Violence: Not on file  Depression (EYV7-0): Not on file  Alcohol Screen: Not on file  Housing: Unknown (01/01/2023)   Received from [facility]   Epic    In the last 12 months, was there a time when you were not able to pay the mortgage or rent on time?: No    Number of Times Moved in the Last Year: Not on file    At any time in the past 12 months, were you homeless or living in a shelter (including now)?: No  Utilities: Not At Risk (01/01/2023)   Received from [facility] Utilities    Threatened with loss of utilities: No  Health Literacy: Not on file    Review of Systems: See HPI, otherwise negative ROS  Physical Exam: Ht 5' 5 (1.651 m)   Wt 110.7 kg   BMI 40.60 kg/m  CONSTITUTIONAL: Well-appearing in no acute distress.  HEENT: Pupils equal, round, Extraocular movements intact. Conjunctivae clear NECK: Neck supple CARDIOVASCULAR: Regular rate, no LE edema  RESPIRATORY: No labored breathing  ABDOMEN: Abdomen soft, nontender, not distended, no guarding, no rigidity SKIN: No apparent skin rashes or lesions. NEUROLOGIC: Normal speech, no focal findings. Mental status alert and oriented x4. PSYCHIATRIC: Mood and affect normal.   Impression/Plan: Diana Bishop is here for an colonoscopy to be performed for surveillance of an AP removed from the transverse in 2019.  Risks, benefits, limitations, and  alternatives regarding  colonoscopy have been reviewed with the patient.  Questions have been answered.  All parties agreeable.   Diana Diana HERO, MD  04/06/2024, 12:24 PM  "

## 2024-04-07 ENCOUNTER — Encounter
Admission: RE | Disposition: A | Discharge status: Home / Self Care | Payer: Self-pay | Source: Home / Self Care | Attending: Gastroenterology

## 2024-04-07 ENCOUNTER — Ambulatory Visit: Payer: Self-pay | Admitting: Anesthesiology

## 2024-04-07 ENCOUNTER — Other Ambulatory Visit: Payer: Self-pay

## 2024-04-07 ENCOUNTER — Ambulatory Visit
Admission: RE | Admit: 2024-04-07 | Discharge: 2024-04-07 | Disposition: A | Discharge status: Home / Self Care | Attending: Gastroenterology | Admitting: Gastroenterology

## 2024-04-07 ENCOUNTER — Encounter: Payer: Self-pay | Admitting: Gastroenterology

## 2024-04-07 DIAGNOSIS — E1140 Type 2 diabetes mellitus with diabetic neuropathy, unspecified: Secondary | ICD-10-CM | POA: Diagnosis not present

## 2024-04-07 DIAGNOSIS — M1990 Unspecified osteoarthritis, unspecified site: Secondary | ICD-10-CM | POA: Insufficient documentation

## 2024-04-07 DIAGNOSIS — K640 First degree hemorrhoids: Secondary | ICD-10-CM

## 2024-04-07 DIAGNOSIS — Z1211 Encounter for screening for malignant neoplasm of colon: Secondary | ICD-10-CM

## 2024-04-07 DIAGNOSIS — K5730 Diverticulosis of large intestine without perforation or abscess without bleeding: Secondary | ICD-10-CM | POA: Diagnosis not present

## 2024-04-07 DIAGNOSIS — K219 Gastro-esophageal reflux disease without esophagitis: Secondary | ICD-10-CM | POA: Diagnosis not present

## 2024-04-07 DIAGNOSIS — I10 Essential (primary) hypertension: Secondary | ICD-10-CM | POA: Insufficient documentation

## 2024-04-07 DIAGNOSIS — Z860100 Personal history of colon polyps, unspecified: Secondary | ICD-10-CM

## 2024-04-07 DIAGNOSIS — Z860101 Personal history of adenomatous and serrated colon polyps: Secondary | ICD-10-CM | POA: Diagnosis not present

## 2024-04-07 DIAGNOSIS — K644 Residual hemorrhoidal skin tags: Secondary | ICD-10-CM | POA: Insufficient documentation

## 2024-04-07 DIAGNOSIS — K635 Polyp of colon: Secondary | ICD-10-CM

## 2024-04-07 DIAGNOSIS — D124 Benign neoplasm of descending colon: Secondary | ICD-10-CM

## 2024-04-07 DIAGNOSIS — M797 Fibromyalgia: Secondary | ICD-10-CM | POA: Diagnosis not present

## 2024-04-07 DIAGNOSIS — F419 Anxiety disorder, unspecified: Secondary | ICD-10-CM | POA: Diagnosis not present

## 2024-04-07 HISTORY — PX: COLONOSCOPY: SHX5424

## 2024-04-07 HISTORY — DX: Sleep apnea, unspecified: G47.30

## 2024-04-07 HISTORY — DX: Unspecified osteoarthritis, unspecified site: M19.90

## 2024-04-07 HISTORY — DX: Gastro-esophageal reflux disease without esophagitis: K21.9

## 2024-04-07 HISTORY — DX: Radiculopathy, lumbar region: M54.16

## 2024-04-07 HISTORY — DX: Other chronic pain: G89.29

## 2024-04-07 HISTORY — PX: POLYPECTOMY: SHX149

## 2024-04-07 LAB — GLUCOSE, CAPILLARY: Glucose-Capillary: 274 mg/dL — ABNORMAL HIGH (ref 70–99)

## 2024-04-07 MED ORDER — STERILE WATER FOR IRRIGATION IR SOLN
Status: DC | PRN
  Administered 2024-04-07: 1

## 2024-04-07 MED ORDER — LIDOCAINE HCL (PF) 2 % IJ SOLN
INTRAMUSCULAR | Status: AC
  Filled 2024-04-07: qty 15

## 2024-04-07 MED ORDER — PROPOFOL 10 MG/ML IV BOLUS
INTRAVENOUS | Status: AC
  Filled 2024-04-07: qty 20

## 2024-04-07 MED ORDER — SODIUM CHLORIDE 0.9 % IV SOLN: INTRAVENOUS | Status: DC

## 2024-04-07 MED ORDER — PROPOFOL 10 MG/ML IV BOLUS
INTRAVENOUS | Status: DC | PRN
  Administered 2024-04-07: 20 mg via INTRAVENOUS
  Administered 2024-04-07 (×2): 50 mg via INTRAVENOUS
  Administered 2024-04-07: 40 mg via INTRAVENOUS
  Administered 2024-04-07: 80 mg via INTRAVENOUS

## 2024-04-07 MED ORDER — GLYCOPYRROLATE 0.2 MG/ML IJ SOLN
INTRAMUSCULAR | Status: AC
  Filled 2024-04-07: qty 1

## 2024-04-07 MED ORDER — LACTATED RINGERS IV SOLN: INTRAVENOUS | Status: DC

## 2024-04-07 MED ORDER — LIDOCAINE 2% (20 MG/ML) 5 ML SYRINGE
INTRAMUSCULAR | Status: DC | PRN
  Administered 2024-04-07: 50 mg via INTRAVENOUS

## 2024-04-07 MED ORDER — PROPOFOL 1000 MG/100ML IV EMUL
INTRAVENOUS | Status: AC
  Filled 2024-04-07: qty 100

## 2024-04-07 NOTE — Op Note (Signed)
 [facility] Gastroenterology Patient Name: Diana Bishop Procedure Date: 04/07/2024 7:11 AM MRN: [id number] Account #: [id number] Date of Birth: 22-Aug-1955 Admit Type: Ambulatory Age: 69 Room: [facility] OR ROOM 01 Gender: Female Note Status: Finalized Instrument Name: Colonoscope [id number] Procedure:             Colonoscopy Indications:           High risk colon cancer surveillance: Personal history                         of colonic polyps Providers:             Clotilda Schaffer, MD Referring MD:          Rock EMERSON Pounds, MD (Referring MD) Medicines:             Propofol  per Anesthesia Complications:         No immediate complications. Procedure:             Pre-Anesthesia Assessment:                        - Prior to the procedure, a History and Physical was                         performed, and patient medications and allergies were                         reviewed. The patient's tolerance of previous                         anesthesia was also reviewed. The risks and benefits                         of the procedure and the sedation options and risks                         were discussed with the patient. All questions were                         answered, and informed consent was obtained. Prior                         Anticoagulants: The patient has taken no anticoagulant                         or antiplatelet agents. ASA Grade Assessment: II - A                         patient with mild systemic disease. After reviewing                         the risks and benefits, the patient was deemed in                         satisfactory condition to undergo the procedure.                        After obtaining informed consent, the colonoscope was  passed under direct vision. Throughout the procedure,                         the patient's blood pressure, pulse, and oxygen                         saturations were monitored continuously. The                          Colonoscope was introduced through the anus and                         advanced to the 10 cm into the ileum. The terminal                         ileum, ileocecal valve, appendiceal orifice, and                         rectum were photographed. Findings:      Two semi-pedunculated polyps were found in the descending colon. The       polyps were 8 to 12 mm in size. These polyps were removed with a cold       snare. Resection and retrieval were complete. Estimated blood loss: none.      A few medium-mouthed diverticula were found in the sigmoid colon and       descending colon.      External and internal hemorrhoids were found. The hemorrhoids were Grade       I (internal hemorrhoids that do not prolapse).      The terminal ileum appeared normal. Impression:            - Two 8 to 12 mm polyps in the descending colon,                         removed with a cold snare. Resected and 1 retrieved.                         The 12mm polyp was not retrieved.                        - Diverticulosis in the sigmoid colon and in the                         descending colon.                        - External and internal hemorrhoids.                        - The examined portion of the ileum was normal. Recommendation:        - Patient has a contact number available for                         emergencies. The signs and symptoms of potential                         delayed complications were discussed with the patient.  Return to normal activities tomorrow. Written                         discharge instructions were provided to the patient.                        - High fiber diet.                        - High fiber diet.                        - Continue present medications.                        - Await pathology results.                        - Repeat colonoscopy in 3 years.                        - The findings and recommendations were discussed with                          the designated responsible adult. Diagnosis Code(s):     --- Professional ---                        Z86.010, Personal history of colonic polyps                        D12.4, Benign neoplasm of descending colon                        K64.0, First degree hemorrhoids                        K57.30, Diverticulosis of large intestine without                         perforation or abscess without bleeding Clotilda Schaffer, MD 04/07/2024 8:01:27 AM Number of Addenda: 0 Note Initiated On: 04/07/2024 7:11 AM Total Procedure Duration: 0 hours 17 minutes 44 seconds  Estimated Blood Loss:  Estimated blood loss: none.      [facility]

## 2024-04-07 NOTE — Anesthesia Postprocedure Evaluation (Signed)
"   Anesthesia Post Note  Patient: Diana Bishop  Procedure(s) Performed: COLONOSCOPY WITH BIOPSY (Rectum) POLYPECTOMY, INTESTINE (Rectum)  Patient location during evaluation: PACU Anesthesia Type: General Level of consciousness: awake and alert Pain management: pain level controlled Vital Signs Assessment: post-procedure vital signs reviewed and stable Respiratory status: spontaneous breathing, nonlabored ventilation, respiratory function stable and patient connected to nasal cannula oxygen Cardiovascular status: blood pressure returned to baseline and stable Postop Assessment: no apparent nausea or vomiting Anesthetic complications: no   No notable events documented.   Last Vitals:  Vitals:   04/07/24 0800 04/07/24 0809  BP: 114/79   Pulse: 85 85  Resp: (!) 23 16  Temp:    SpO2: 97% 97%    Last Pain:  Vitals:   04/07/24 0800  TempSrc:   PainSc: Asleep                 Fairy A Eithen Castiglia      "

## 2024-04-07 NOTE — Transfer of Care (Signed)
 Immediate Anesthesia Transfer of Care Note  Patient: Diana Bishop  Procedure(s) Performed: COLONOSCOPY WITH BIOPSY (Rectum) POLYPECTOMY, INTESTINE (Rectum)  Patient Location: PACU  Anesthesia Type: General  Level of Consciousness: awake, alert  and patient cooperative  Airway and Oxygen Therapy: Patient Spontanous Breathing and Patient connected to supplemental oxygen  Post-op Assessment: Post-op Vital signs reviewed, Patient's Cardiovascular Status Stable, Respiratory Function Stable, Patent Airway and No signs of Nausea or vomiting  Post-op Vital Signs: Reviewed and stable  Complications: No notable events documented.

## 2024-04-13 ENCOUNTER — Ambulatory Visit: Payer: Self-pay | Admitting: Gastroenterology

## 2024-04-13 LAB — SURGICAL PATHOLOGY
